# Patient Record
Sex: Male | Born: 1995 | Race: White | Hispanic: No | Marital: Single | Smoking: Never smoker
Health system: Southern US, Community
[De-identification: ages and names within clinical notes are randomized; demographics above are authoritative.]

## PROBLEM LIST (undated history)

## (undated) DIAGNOSIS — K219 Gastro-esophageal reflux disease without esophagitis: Principal | ICD-10-CM

## (undated) DIAGNOSIS — F319 Bipolar disorder, unspecified: Secondary | ICD-10-CM

## (undated) DIAGNOSIS — T7840XA Allergy, unspecified, initial encounter: Secondary | ICD-10-CM

## (undated) DIAGNOSIS — B019 Varicella without complication: Secondary | ICD-10-CM

## (undated) DIAGNOSIS — L709 Acne, unspecified: Secondary | ICD-10-CM

## (undated) DIAGNOSIS — R51 Headache: Secondary | ICD-10-CM

## (undated) DIAGNOSIS — J329 Chronic sinusitis, unspecified: Secondary | ICD-10-CM

## (undated) DIAGNOSIS — E663 Overweight: Secondary | ICD-10-CM

## (undated) DIAGNOSIS — F32A Depression, unspecified: Secondary | ICD-10-CM

## (undated) DIAGNOSIS — F329 Major depressive disorder, single episode, unspecified: Secondary | ICD-10-CM

## (undated) HISTORY — DX: Gastro-esophageal reflux disease without esophagitis: K21.9

## (undated) HISTORY — DX: Acne, unspecified: L70.9

## (undated) HISTORY — PX: TYMPANOSTOMY TUBE PLACEMENT: SHX32

## (undated) HISTORY — DX: Depression, unspecified: F32.A

## (undated) HISTORY — DX: Allergy, unspecified, initial encounter: T78.40XA

## (undated) HISTORY — DX: Chronic sinusitis, unspecified: J32.9

## (undated) HISTORY — DX: Bipolar disorder, unspecified: F31.9

## (undated) HISTORY — DX: Overweight: E66.3

## (undated) HISTORY — DX: Varicella without complication: B01.9

## (undated) HISTORY — DX: Headache: R51

## (undated) HISTORY — DX: Major depressive disorder, single episode, unspecified: F32.9

---
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
## 2013-05-06 ENCOUNTER — Ambulatory Visit (INDEPENDENT_AMBULATORY_CARE_PROVIDER_SITE_OTHER): Admitting: Family Medicine

## 2013-05-06 ENCOUNTER — Encounter: Payer: Self-pay | Admitting: Family Medicine

## 2013-05-06 VITALS — BP 112/80 | HR 72 | Temp 97.8°F | Ht 79.0 in | Wt 319.1 lb

## 2013-05-06 DIAGNOSIS — R109 Unspecified abdominal pain: Secondary | ICD-10-CM

## 2013-05-06 DIAGNOSIS — T7840XA Allergy, unspecified, initial encounter: Secondary | ICD-10-CM

## 2013-05-06 DIAGNOSIS — R10A0 Flank pain, unspecified side: Secondary | ICD-10-CM

## 2013-05-06 DIAGNOSIS — E663 Overweight: Secondary | ICD-10-CM

## 2013-05-06 DIAGNOSIS — R519 Headache, unspecified: Secondary | ICD-10-CM | POA: Insufficient documentation

## 2013-05-06 DIAGNOSIS — F329 Major depressive disorder, single episode, unspecified: Secondary | ICD-10-CM | POA: Insufficient documentation

## 2013-05-06 DIAGNOSIS — R5381 Other malaise: Secondary | ICD-10-CM

## 2013-05-06 DIAGNOSIS — R51 Headache: Secondary | ICD-10-CM

## 2013-05-06 DIAGNOSIS — F319 Bipolar disorder, unspecified: Secondary | ICD-10-CM

## 2013-05-06 DIAGNOSIS — R5383 Other fatigue: Secondary | ICD-10-CM

## 2013-05-06 DIAGNOSIS — B019 Varicella without complication: Secondary | ICD-10-CM | POA: Insufficient documentation

## 2013-05-06 DIAGNOSIS — K219 Gastro-esophageal reflux disease without esophagitis: Secondary | ICD-10-CM

## 2013-05-06 DIAGNOSIS — F32A Depression, unspecified: Secondary | ICD-10-CM

## 2013-05-06 HISTORY — DX: Headache: R51

## 2013-05-06 HISTORY — DX: Gastro-esophageal reflux disease without esophagitis: K21.9

## 2013-05-06 LAB — HEPATIC FUNCTION PANEL
ALT: 13 U/L (ref 0–53)
Albumin: 4.8 g/dL (ref 3.5–5.2)
Total Protein: 7.5 g/dL (ref 6.0–8.3)

## 2013-05-06 LAB — RENAL FUNCTION PANEL
Albumin: 4.8 g/dL (ref 3.5–5.2)
BUN: 11 mg/dL (ref 6–23)
Calcium: 10 mg/dL (ref 8.4–10.5)
Creat: 0.95 mg/dL (ref 0.50–1.35)
Glucose, Bld: 91 mg/dL (ref 70–99)
Phosphorus: 3.6 mg/dL (ref 2.3–4.6)

## 2013-05-06 LAB — CBC
Hemoglobin: 16 g/dL (ref 13.0–17.0)
MCHC: 34.8 g/dL (ref 30.0–36.0)
Platelets: 273 10*3/uL (ref 150–400)
RBC: 5.61 MIL/uL (ref 4.22–5.81)

## 2013-05-06 LAB — T4, FREE: Free T4: 1.17 ng/dL (ref 0.80–1.80)

## 2013-05-06 LAB — LIPID PANEL
Cholesterol: 181 mg/dL (ref 0–200)
Total CHOL/HDL Ratio: 4.9 Ratio
Triglycerides: 186 mg/dL — ABNORMAL HIGH (ref <150)
VLDL: 37 mg/dL (ref 0–40)

## 2013-05-06 MED ORDER — QUETIAPINE FUMARATE 100 MG PO TABS: 100.0000 mg | ORAL_TABLET | Freq: Every day | ORAL | Status: DC

## 2013-05-06 MED ORDER — CETIRIZINE HCL 10 MG PO TABS: 10.0000 mg | ORAL_TABLET | Freq: Every day | ORAL | Status: DC | PRN

## 2013-05-06 MED ORDER — RANITIDINE HCL 300 MG PO TABS: 300.0000 mg | ORAL_TABLET | Freq: Every evening | ORAL | Status: DC | PRN

## 2013-05-06 NOTE — Patient Instructions (Addendum)
Probiotic such as Digestive Advantage or a generic 2 Tums at bedtime  DASH Diet The DASH diet stands for "Dietary Approaches to Stop Hypertension." It is a healthy eating plan that has been shown to reduce high blood pressure (hypertension) in as little as 14 days, while also possibly providing other significant health benefits. These other health benefits include reducing the risk of breast cancer after menopause and reducing the risk of type 2 diabetes, heart disease, colon cancer, and stroke. Health benefits also include weight loss and slowing kidney failure in patients with chronic kidney disease.  DIET GUIDELINES  Limit salt (sodium). Your diet should contain less than 1500 mg of sodium daily.  Limit refined or processed carbohydrates. Your diet should include mostly whole grains. Desserts and added sugars should be used sparingly.  Include small amounts of heart-healthy fats. These types of fats include nuts, oils, and tub margarine. Limit saturated and trans fats. These fats have been shown to be harmful in the body. CHOOSING FOODS  The following food groups are based on a 2000 calorie diet. See your Registered Dietitian for individual calorie needs. Grains and Grain Products (6 to 8 servings daily)  Eat More Often: Whole-wheat bread, brown rice, whole-grain or wheat pasta, quinoa, popcorn without added fat or salt (air popped).  Eat Less Often: White bread, white pasta, white rice, cornbread. Vegetables (4 to 5 servings daily)  Eat More Often: Fresh, frozen, and canned vegetables. Vegetables may be raw, steamed, roasted, or grilled with a minimal amount of fat.  Eat Less Often/Avoid: Creamed or fried vegetables. Vegetables in a cheese sauce. Fruit (4 to 5 servings daily)  Eat More Often: All fresh, canned (in natural juice), or frozen fruits. Dried fruits without added sugar. One hundred percent fruit juice ( cup [237 mL] daily).  Eat Less Often: Dried fruits with added sugar.  Canned fruit in light or heavy syrup. [employer], Fish, and Poultry (2 servings or less daily. One serving is 3 to 4 oz [85-114 g]).  Eat More Often: Ninety percent or leaner ground beef, tenderloin, sirloin. Round cuts of beef, chicken breast, [place] breast. All fish. Grill, bake, or broil your meat. Nothing should be fried.  Eat Less Often/Avoid: Fatty cuts of meat, [place], or chicken leg, thigh, or wing. Fried cuts of meat or fish. Dairy (2 to 3 servings)  Eat More Often: Low-fat or fat-free milk, low-fat plain or light yogurt, reduced-fat or part-skim cheese.  Eat Less Often/Avoid: Milk (whole, 2%).Whole milk yogurt. Full-fat cheeses. Nuts, Seeds, and Legumes (4 to 5 servings per week)  Eat More Often: All without added salt.  Eat Less Often/Avoid: Salted nuts and seeds, canned beans with added salt. Fats and Sweets (limited)  Eat More Often: Vegetable oils, tub margarines without trans fats, sugar-free gelatin. Mayonnaise and salad dressings.  Eat Less Often/Avoid: Coconut oils, palm oils, butter, stick margarine, cream, half and half, cookies, candy, pie. FOR MORE INFORMATION The Dash Diet Eating Plan: www.dashdiet.org Document Released: 08/18/2011 Document Revised: 11/21/2011 Document Reviewed: 08/18/2011 [facility] Patient Information 2014 [place], [place].

## 2013-05-07 LAB — URINALYSIS
Bilirubin Urine: NEGATIVE
Glucose, UA: NEGATIVE mg/dL
Ketones, ur: NEGATIVE mg/dL
Specific Gravity, Urine: 1.022 (ref 1.005–1.030)
Urobilinogen, UA: 0.2 mg/dL (ref 0.0–1.0)

## 2013-05-07 NOTE — Progress Notes (Signed)
Quick Note:  Patient Informed and voiced understanding ______ 

## 2013-05-12 ENCOUNTER — Encounter: Payer: Self-pay | Admitting: Family Medicine

## 2013-05-12 DIAGNOSIS — T7840XA Allergy, unspecified, initial encounter: Secondary | ICD-10-CM

## 2013-05-12 HISTORY — DX: Allergy, unspecified, initial encounter: T78.40XA

## 2013-05-12 NOTE — Progress Notes (Signed)
Patient ID: Lawrence Collins, male   DOB: 04-21-1996, 17 y.o.   MRN: [id number] Lawrence Collins [id number] 09/05/1996 05/12/2013      Progress Note New Patient  Subjective  Chief Complaint  Chief Complaint  Patient presents with  . Establish Care    new patient    HPI  Patient is a 17 year old male in today for his mother to discuss multiple concerns. He has a history of depression and has been recently diagnosed with bipolar depression. He was started on Lamictal but he says it actually made him feel more manic with focusing and sleeping. He stopped it and feels better but still has high levels of anxiety and depression. Denies suicidal ideation but is ready to start a different medication. Is also complaining of fatigue. Complains of frequent headaches. Has pain behind his left eye with photophobia and phonophobia. Has some nausea but no vomiting. Has frequent heartburn and uses TUMS routinely. Has allergies with postnasal drip and nasal congestion. No fevers or chills. No chest pain, palpitations, shortness of breath, GI or GU concerns at this time.  Past Medical History  Diagnosis Date  . Depression     bi-polar- type 2  . Chicken pox 17 yrs old  . Overweight   . Esophageal reflux 05/06/2013  . Headache(784.0) 05/06/2013  . Allergic state 05/12/2013    Past Surgical History  Procedure Laterality Date  . Tympanostomy tube placement  as a child    Family History  Problem Relation Age of Onset  . Graves' disease Mother   . Alcohol abuse Father   . Depression Father   . Bipolar disorder Father   . Drug abuse Father   . Hypertension Father   . Hyperlipidemia Father   . Asthma Brother   . Depression Brother   . Cancer Maternal Grandmother 8    colon  . Emphysema Maternal Grandfather     smoker  . Hypertension Maternal Grandfather   . Hyperlipidemia Maternal Grandfather   . Alcohol abuse Paternal Grandmother   . Alcohol abuse Paternal Grandfather   . Mental illness Maternal  Aunt     schizophrenia, depression  . Depression Maternal Aunt     History   Social History  . Marital Status: Single    Spouse Name: N/A    Number of Children: N/A  . Years of Education: N/A   Occupational History  . Not on file.   Social History Main Topics  . Smoking status: Never Smoker   . Smokeless tobacco: Never Used  . Alcohol Use: No  . Drug Use: No  . Sexual Activity: No   Other Topics Concern  . Not on file   Social History Narrative  . No narrative on file    No current outpatient prescriptions on file prior to visit.   No current facility-administered medications on file prior to visit.    No Known Allergies  Review of Systems  Review of Systems  Constitutional: Positive for malaise/fatigue. Negative for fever and chills.  HENT: Positive for congestion. Negative for hearing loss and nosebleeds.   Eyes: Positive for photophobia. Negative for discharge.  Respiratory: Positive for sputum production. Negative for cough, shortness of breath and wheezing.   Cardiovascular: Negative for chest pain, palpitations and leg swelling.  Gastrointestinal: Positive for heartburn. Negative for nausea, vomiting, abdominal pain, diarrhea, constipation and blood in stool.  Genitourinary: Negative for dysuria, urgency, frequency and hematuria.  Musculoskeletal: Negative for myalgias, back pain and falls.  Skin: Negative  for rash.  Neurological: Positive for headaches. Negative for dizziness, tremors, sensory change, focal weakness, loss of consciousness and weakness.  Endo/Heme/Allergies: Negative for polydipsia. Does not bruise/bleed easily.  Psychiatric/Behavioral: Positive for depression. Negative for suicidal ideas. The patient is nervous/anxious. The patient does not have insomnia.     Objective  BP 112/80  Pulse 72  Temp(Src) 97.8 F (36.6 C) (Oral)  Ht 6\' 7"  (2.007 m)  Wt 319 lb 1.3 oz (144.734 kg)  BMI 35.93 kg/m2  SpO2 97%  Physical Exam  Physical  Exam  Constitutional: He is oriented to person, place, and time and well-developed, well-nourished, and in no distress. No distress.  HENT:  Head: Normocephalic and atraumatic.  Eyes: Conjunctivae are normal.  Neck: Neck supple. No thyromegaly present.  Cardiovascular: Normal rate, regular rhythm and normal heart sounds.   No murmur heard. Pulmonary/Chest: Effort normal and breath sounds normal. No respiratory distress.  Abdominal: He exhibits no distension and no mass. There is no tenderness.  Musculoskeletal: He exhibits no edema.  Neurological: He is alert and oriented to person, place, and time.  Skin: Skin is warm.  Psychiatric: Memory, affect and judgment normal.       Assessment & Plan  Headache(784.0) Discussed HA hygiene with regular exercise, good sleep, small frequent meals with lean proteins. Adequate hydration. May use over-the-counter Excedrin or ibuprofen when necessary  Depression Given Lamictal in the past by the mood clinic in [place]. Did not find it helpful. Says it actually made his mania worse. Agrees to start Seroquel and referred to psychiatry  For further consideration  Esophageal reflux Avoid offending foods, started on probiotics and Zantac  Overweight Encouraged DASH diet and increased exercise  Allergic state Cetirizine prn, nasal saline prn

## 2013-05-12 NOTE — Assessment & Plan Note (Addendum)
Given Lamictal in the past by the mood clinic in [place]. Did not find it helpful. Says it actually made his mania worse. Agrees to start Seroquel and referred to psychiatry  For further consideration

## 2013-05-12 NOTE — Assessment & Plan Note (Signed)
Discussed HA hygiene with regular exercise, good sleep, small frequent meals with lean proteins. Adequate hydration. May use over-the-counter Excedrin or ibuprofen when necessary

## 2013-05-12 NOTE — Assessment & Plan Note (Signed)
Encouraged DASH diet and increased exercise 

## 2013-05-12 NOTE — Assessment & Plan Note (Signed)
Avoid offending foods, started on probiotics and Zantac

## 2013-05-12 NOTE — Assessment & Plan Note (Signed)
Cetirizine prn, nasal saline prn

## 2013-05-31 ENCOUNTER — Encounter: Payer: Self-pay | Admitting: Family Medicine

## 2013-05-31 ENCOUNTER — Ambulatory Visit (INDEPENDENT_AMBULATORY_CARE_PROVIDER_SITE_OTHER): Admitting: Family Medicine

## 2013-05-31 VITALS — BP 130/82 | HR 91 | Temp 98.4°F | Ht 79.0 in | Wt 320.1 lb

## 2013-05-31 DIAGNOSIS — R51 Headache: Secondary | ICD-10-CM

## 2013-05-31 DIAGNOSIS — J029 Acute pharyngitis, unspecified: Secondary | ICD-10-CM

## 2013-05-31 DIAGNOSIS — Z5189 Encounter for other specified aftercare: Secondary | ICD-10-CM

## 2013-05-31 DIAGNOSIS — T7840XD Allergy, unspecified, subsequent encounter: Secondary | ICD-10-CM

## 2013-05-31 DIAGNOSIS — Z23 Encounter for immunization: Secondary | ICD-10-CM

## 2013-05-31 DIAGNOSIS — K219 Gastro-esophageal reflux disease without esophagitis: Secondary | ICD-10-CM

## 2013-05-31 DIAGNOSIS — J329 Chronic sinusitis, unspecified: Secondary | ICD-10-CM

## 2013-05-31 DIAGNOSIS — F329 Major depressive disorder, single episode, unspecified: Secondary | ICD-10-CM

## 2013-05-31 MED ORDER — LAMOTRIGINE 25 MG PO TABS: ORAL_TABLET | ORAL | Status: DC

## 2013-05-31 MED ORDER — AZITHROMYCIN 250 MG PO TABS: ORAL_TABLET | ORAL | Status: DC

## 2013-05-31 NOTE — Patient Instructions (Addendum)
Consider Krill oil caps such as MegaRed daily by [employer] probiotic such as Digestive Advantage daily   Gastroesophageal Reflux Disease, Adult Gastroesophageal reflux disease (GERD) happens when acid from your stomach flows up into the esophagus. When acid comes in contact with the esophagus, the acid causes soreness (inflammation) in the esophagus. Over time, GERD may create small holes (ulcers) in the lining of the esophagus. CAUSES   Increased body weight. This puts pressure on the stomach, making acid rise from the stomach into the esophagus.  Smoking. This increases acid production in the stomach.  Drinking alcohol. This causes decreased pressure in the lower esophageal sphincter (valve or ring of muscle between the esophagus and stomach), allowing acid from the stomach into the esophagus.  Late evening meals and a full stomach. This increases pressure and acid production in the stomach.  A malformed lower esophageal sphincter. Sometimes, no cause is found. SYMPTOMS   Burning pain in the lower part of the mid-chest behind the breastbone and in the mid-stomach area. This may occur twice a week or more often.  Trouble swallowing.  Sore throat.  Dry cough.  Asthma-like symptoms including chest tightness, shortness of breath, or wheezing. DIAGNOSIS  Your caregiver may be able to diagnose GERD based on your symptoms. In some cases, X-rays and other tests may be done to check for complications or to check the condition of your stomach and esophagus. TREATMENT  Your caregiver may recommend over-the-counter or prescription medicines to help decrease acid production. Ask your caregiver before starting or adding any new medicines.  HOME CARE INSTRUCTIONS   Change the factors that you can control. Ask your caregiver for guidance concerning weight loss, quitting smoking, and alcohol consumption.  Avoid foods and drinks that make your symptoms worse, such as:  Caffeine or  alcoholic drinks.  Chocolate.  Peppermint or mint flavorings.  Garlic and onions.  Spicy foods.  Citrus fruits, such as oranges, lemons, or limes.  Tomato-based foods such as sauce, chili, salsa, and pizza.  Fried and fatty foods.  Avoid lying down for the 3 hours prior to your bedtime or prior to taking a nap.  Eat small, frequent meals instead of large meals.  Wear loose-fitting clothing. Do not wear anything tight around your waist that causes pressure on your stomach.  Raise the head of your bed 6 to 8 inches with wood blocks to help you sleep. Extra pillows will not help.  Only take over-the-counter or prescription medicines for pain, discomfort, or fever as directed by your caregiver.  Do not take aspirin, ibuprofen, or other nonsteroidal anti-inflammatory drugs (NSAIDs). SEEK IMMEDIATE MEDICAL CARE IF:   You have pain in your arms, neck, jaw, teeth, or back.  Your pain increases or changes in intensity or duration.  You develop nausea, vomiting, or sweating (diaphoresis).  You develop shortness of breath, or you faint.  Your vomit is green, yellow, black, or looks like coffee grounds or blood.  Your stool is red, bloody, or black. These symptoms could be signs of other problems, such as heart disease, gastric bleeding, or esophageal bleeding. MAKE SURE YOU:   Understand these instructions.  Will watch your condition.  Will get help right away if you are not doing well or get worse. Document Released: 06/08/2005 Document Revised: 11/21/2011 Document Reviewed: 03/18/2011 [facility] Patient Information 2014 [place], [place].

## 2013-06-02 ENCOUNTER — Encounter: Payer: Self-pay | Admitting: Family Medicine

## 2013-06-02 DIAGNOSIS — J329 Chronic sinusitis, unspecified: Secondary | ICD-10-CM

## 2013-06-02 HISTORY — DX: Chronic sinusitis, unspecified: J32.9

## 2013-06-02 NOTE — Assessment & Plan Note (Signed)
Patient not sure he has noted a difference but will continue Zyrtec daily for now

## 2013-06-02 NOTE — Assessment & Plan Note (Signed)
improving

## 2013-06-02 NOTE — Assessment & Plan Note (Signed)
Patient started and titrated up Seroquel without difficulty and is taking 300 mg a day. Because he was worried about back slightly he continues his Lamictal. He does report he feels more stable and is at no risk to himself. Will have him drop his Lamictal down to 50 mg daily for the next week and then 25 mg daily and maintain adequate he seen by psychiatry. He has an appointment with psychiatry early October. No trouble with the Seroquel as noted continue 300 mg daily

## 2013-06-02 NOTE — Progress Notes (Signed)
Patient ID: Lawrence Collins, male   DOB: [birth year], 17 y.o.   MRN: [id number] Lawrence Collins [id number] May 30, 1996 06/02/2013      Progress Note-Follow Up  Subjective  Chief Complaint  Chief Complaint  Patient presents with  . Follow-up    3 week    HPI  This is a 17 year old Caucasian male in today for followup. He is doing better since the initiation of Seroquel. Because he was worried about However he did continue his Lamictal as well. He denies any chest pain, palpitations, shortness of breath, increasing anxiety and depression in fact does report he feels more stable. Continues to struggle with sputum production and a sense of globus. Coughs up sputum regularly. Has headaches although they are somewhat improved. No fevers or chills. Has appointment with psychiatry in the next 2 weeks. Has been noting a sore throat and some recent trouble with swallowing as well. Does report his dyspepsia is somewhat improved. No chest pain or palpitations no change in bowel habits no urinary complaints  Past Medical History  Diagnosis Date  . Depression     bi-polar- type 2  . Chicken pox 17 yrs old  . Overweight   . Esophageal reflux 05/06/2013  . Headache(784.0) 05/06/2013  . Allergic state 05/12/2013  . Unspecified sinusitis (chronic) 06/02/2013    Past Surgical History  Procedure Laterality Date  . Tympanostomy tube placement  as a child    Family History  Problem Relation Age of Onset  . Graves' disease Mother   . Alcohol abuse Father   . Depression Father   . Bipolar disorder Father   . Drug abuse Father   . Hypertension Father   . Hyperlipidemia Father   . Asthma Brother   . Depression Brother   . Cancer Maternal Grandmother 23    colon  . Emphysema Maternal Grandfather     smoker  . Hypertension Maternal Grandfather   . Hyperlipidemia Maternal Grandfather   . Alcohol abuse Paternal Grandmother   . Alcohol abuse Paternal Grandfather   . Mental illness Maternal Aunt      schizophrenia, depression  . Depression Maternal Aunt     History   Social History  . Marital Status: Single    Spouse Name: N/A    Number of Children: N/A  . Years of Education: N/A   Occupational History  . Not on file.   Social History Main Topics  . Smoking status: Never Smoker   . Smokeless tobacco: Never Used  . Alcohol Use: No  . Drug Use: No  . Sexual Activity: No   Other Topics Concern  . Not on file   Social History Narrative  . No narrative on file    Current Outpatient Prescriptions on File Prior to Visit  Medication Sig Dispense Refill  . cetirizine (ZYRTEC) 10 MG tablet Take 1 tablet (10 mg total) by mouth daily as needed for allergies or rhinitis.  30 tablet  2  . QUEtiapine (SEROQUEL) 100 MG tablet Take 1 tablet (100 mg total) by mouth at bedtime. 1/2 tab po once then increase to 1 tab po qhs x once then increases to 2 tabs po qhs x once then 3 tabs po qhs  90 tablet  1  . ranitidine (ZANTAC) 300 MG tablet Take 1 tablet (300 mg total) by mouth at bedtime as needed for heartburn.  30 tablet  2   No current facility-administered medications on file prior to visit.    No Known Allergies  Review of Systems  Review of Systems  Constitutional: Positive for malaise/fatigue. Negative for fever.  HENT: Positive for congestion.   Eyes: Negative for discharge.  Respiratory: Positive for cough and sputum production. Negative for shortness of breath.   Cardiovascular: Negative for chest pain, palpitations and leg swelling.  Gastrointestinal: Negative for nausea, abdominal pain and diarrhea.  Genitourinary: Negative for dysuria.  Musculoskeletal: Negative for falls.  Skin: Negative for rash.  Neurological: Negative for loss of consciousness and headaches.  Endo/Heme/Allergies: Negative for polydipsia.  Psychiatric/Behavioral: Positive for depression. Negative for suicidal ideas. The patient is not nervous/anxious and does not have insomnia.       Objective  BP 130/82  Pulse 91  Temp(Src) 98.4 F (36.9 C) (Oral)  Ht 6\' 7"  (2.007 m)  Wt 320 lb 1.3 oz (145.187 kg)  BMI 36.04 kg/m2  SpO2 98%  Physical Exam  Physical Exam  Constitutional: He is oriented to person, place, and time and well-developed, well-nourished, and in no distress. No distress.  HENT:  Head: Normocephalic and atraumatic.  Eyes: Conjunctivae are normal.  Neck: Neck supple. No thyromegaly present.  Cardiovascular: Normal rate, regular rhythm and normal heart sounds.   No murmur heard. Pulmonary/Chest: Effort normal and breath sounds normal. No respiratory distress.  Abdominal: He exhibits no distension and no mass. There is no tenderness.  Musculoskeletal: He exhibits no edema.  Neurological: He is alert and oriented to person, place, and time.  Skin: Skin is warm.  Psychiatric: Memory, affect and judgment normal.    Lab Results  Component Value Date   TSH 2.844 05/06/2013   Lab Results  Component Value Date   WBC 8.2 05/06/2013   HGB 16.0 05/06/2013   HCT 46.0 05/06/2013   MCV 82.0 05/06/2013   PLT 273 05/06/2013   Lab Results  Component Value Date   CREATININE 0.95 05/06/2013   BUN 11 05/06/2013   NA 141 05/06/2013   K 4.9 05/06/2013   CL 106 05/06/2013   CO2 26 05/06/2013   Lab Results  Component Value Date   ALT 13 05/06/2013   AST 15 05/06/2013   ALKPHOS 75 05/06/2013   BILITOT 0.6 05/06/2013   Lab Results  Component Value Date   CHOL 181 05/06/2013   Lab Results  Component Value Date   HDL 37* 05/06/2013   Lab Results  Component Value Date   LDLCALC 107* 05/06/2013   Lab Results  Component Value Date   TRIG 186* 05/06/2013   Lab Results  Component Value Date   CHOLHDL 4.9 05/06/2013     Assessment & Plan  Depression Patient started and titrated up Seroquel without difficulty and is taking 300 mg a day. Because he was worried about back slightly he continues his Lamictal. He does report he feels more stable and is at no  risk to himself. Will have him drop his Lamictal down to 50 mg daily for the next week and then 25 mg daily and maintain adequate he seen by psychiatry. He has an appointment with psychiatry early October. No trouble with the Seroquel as noted continue 300 mg daily  Allergic state Patient not sure he has noted a difference but will continue Zyrtec daily for now  Esophageal reflux Improved with Zantac, encouraged probiotic as well. Does still note some mild dysphagia still will continue current treatment and consider referral to Gastroenterology if symptoms do not improve.   Headache(784.0) improving  Unspecified sinusitis (chronic) Will try a course of antibiotics, probiotics and mucinex

## 2013-06-02 NOTE — Assessment & Plan Note (Signed)
Will try a course of antibiotics, probiotics and mucinex

## 2013-06-02 NOTE — Assessment & Plan Note (Signed)
Improved with Zantac, encouraged probiotic as well. Does still note some mild dysphagia still will continue current treatment and consider referral to Gastroenterology if symptoms do not improve.

## 2013-06-13 ENCOUNTER — Ambulatory Visit (HOSPITAL_COMMUNITY): Admitting: Physician Assistant

## 2013-06-13 ENCOUNTER — Encounter (HOSPITAL_COMMUNITY): Payer: Self-pay | Admitting: Psychiatry

## 2013-06-13 ENCOUNTER — Ambulatory Visit (INDEPENDENT_AMBULATORY_CARE_PROVIDER_SITE_OTHER): Admitting: Psychiatry

## 2013-06-13 VITALS — BP 116/70 | HR 75 | Ht 79.0 in | Wt 316.8 lb

## 2013-06-13 DIAGNOSIS — F3130 Bipolar disorder, current episode depressed, mild or moderate severity, unspecified: Secondary | ICD-10-CM

## 2013-06-13 DIAGNOSIS — F319 Bipolar disorder, unspecified: Secondary | ICD-10-CM

## 2013-06-13 MED ORDER — BUPROPION HCL ER (XL) 150 MG PO TB24: 150.0000 mg | ORAL_TABLET | ORAL | Status: DC
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
## 2013-06-28 ENCOUNTER — Ambulatory Visit: Payer: Self-pay | Admitting: Family Medicine

## 2013-07-01 ENCOUNTER — Ambulatory Visit (HOSPITAL_COMMUNITY): Payer: Self-pay | Admitting: Psychiatry

## 2013-07-12 ENCOUNTER — Encounter: Payer: Self-pay | Admitting: Family Medicine

## 2013-07-12 ENCOUNTER — Ambulatory Visit (INDEPENDENT_AMBULATORY_CARE_PROVIDER_SITE_OTHER): Admitting: Family Medicine

## 2013-07-12 VITALS — BP 124/88 | HR 67 | Temp 98.5°F | Ht 79.0 in | Wt 315.1 lb

## 2013-07-12 DIAGNOSIS — E663 Overweight: Secondary | ICD-10-CM

## 2013-07-12 DIAGNOSIS — F3130 Bipolar disorder, current episode depressed, mild or moderate severity, unspecified: Secondary | ICD-10-CM

## 2013-07-12 DIAGNOSIS — F319 Bipolar disorder, unspecified: Secondary | ICD-10-CM

## 2013-07-12 MED ORDER — QUETIAPINE FUMARATE 300 MG PO TABS: 300.0000 mg | ORAL_TABLET | Freq: Every day | ORAL | Status: DC

## 2013-07-12 NOTE — Patient Instructions (Signed)
Manic Depression (Bipolar Disorder)  Bipolar disorder is also known as manic depressive illness. It is when the brain does not function properly and causes shifts in a person's moods, energy and ability to function in everyday life. These shifts are different from the normal ups and downs that everyone experiences. Instead the shifts are severe. If this goes untreated, the person's life becomes more and more disorderly. People with this disorder can be treated can lead full and productive lives. This disorder must be managed throughout life.   SYMPTOMS    Bipolar disorder causes dramatic mood swings. These mood swings go in cycles. They cycle from extreme "highs" and irritable to deep "lows" of sadness and hopelessness.   Between the extreme moods, there are usually periods of normal mood.   Along with the mood shifts, the person will have severe changes in energy and behavior. The periods of "highs" and "lows" are called episodes of mania and depression.  Signs of mania:   Lots of energy, activity and restlessness.   Extreme "high" or good mood.   Extreme irritability.   Racing thoughts and talking very fast.   Jumping from one idea to another.   Not able to focus, easily distracted.   Little need to sleep.   Grand beliefs in one's abilities and powers.   Spending sprees.   Increased sexual drive. This can result in many sexual partners.   Poor judgment.   Abuse of drugs, particularly cocaine, alcohol, and sleeping medication.   Aggressive or provocative behavior.   A lasting period of behavior that is different from usual.   Denial that anything is wrong.  *A manic episode is identified if a "high" mood happens with three or more of the other symptoms lasting most of the day, nearly everyday for a week or longer. If the mood is more irritable in nature, four additional symptoms must be present.  Signs of depression:   Lasting feelings of sadness, anxiety, or empty mood.   Feelings of  hopelessness with negative thoughts.   Feelings of guilt, worthlessness, or helplessness.   Loss of interest or pleasure in activities once enjoyed, including sex.   Feelings of fatigue or having less energy.   Trouble focusing, making decisions, remembering.   Feeling restless or irritable.   Sleeping too little or too much.   Change in eating with possible weight gain or loss.   Feeling ongoing pain that is not caused by physical illness or injury.   Thoughts of death or suicide or suicide attempts.  *A depressive episode is identified as having five or more of the above symptoms that last most of the day, nearly everyday for two weeks or longer.  CAUSES    Research shows that there is no single cause for the disorder. Many factors act together to produce the illness.   This can be passed down from family (hereditary).   Environment may play a part.  TREATMENT    Long-term treatment is strongly recommended because bipolar disorder is a repeated illness. This disorder is better controlled if treatment is ongoing than if it is off and on.   A combination of medication and talk therapy is best for managing the disorder over time.   Medication.   Medication can be prescribed by a doctor that is an expert in treating mental disorders (psychiatrists). Medications known as "mood stabilizers" are usually prescribed to help control the illness. Other medications can be added when needed. These medicines usually treat episodes   of mania or depression that break through despite the mood stabilizer.   Talk Therapy.   Along with medication, some forms of talk therapy are helpful in providing support, education and guidance to people with the illness and their families. Studies show that this type of treatment increases mood stability, decreases need for hospitalization and improves how they function society.   Electroconvulsive Therapy (ECT).   In extreme situations where the above treatments do not work or  work too slowly to relieve severe symptoms, ECT may be considered.  Document Released: 12/05/2000 Document Revised: 11/21/2011 Document Reviewed: 07/27/2007  Exit[employer] Patient Information 2014 [employer].

## 2013-07-14 ENCOUNTER — Encounter: Payer: Self-pay | Admitting: Family Medicine

## 2013-07-14 NOTE — Assessment & Plan Note (Signed)
Improving on Seroquel and Wellbutrin. Will continue to follow with psychiatry.

## 2013-07-14 NOTE — Progress Notes (Signed)
Patient ID: Lawrence Collins, male   DOB: 12/11/1995, 17 y.o.   MRN: [id number] Lawrence Collins [id number] 1996-07-22 07/14/2013      Progress Note-Follow Up  Subjective  Chief Complaint  Chief Complaint  Patient presents with  . Follow-up    4 week    HPI  Patient is a 17 year old Caucasian male who is here today in followup. At his last visit we started him on Cipro and referred him to psychiatry. He has been diagnosed with bipolar disorder and they have added Wellbutrin. He reports having some hallucinations last week but none this week. Denies suicidal or homicidal ideation and says he does feel better and more calm. No other acute complaints. No chest pain, palpitations, shortness of breath, GI or GU concerns at this time.  Past Medical History  Diagnosis Date  . Depression     bi-polar- type 2  . Chicken pox 17 yrs old  . Overweight   . Esophageal reflux 05/06/2013  . Headache(784.0) 05/06/2013  . Allergic state 05/12/2013  . Unspecified sinusitis (chronic) 06/02/2013  . Bipolar depression     bi-polar- type 2 Lamictal made mania worse     Past Surgical History  Procedure Laterality Date  . Tympanostomy tube placement  as a child    Family History  Problem Relation Age of Onset  . Graves' disease Mother   . Alcohol abuse Father   . Depression Father   . Bipolar disorder Father   . Drug abuse Father   . Hypertension Father   . Hyperlipidemia Father   . Asthma Brother   . Depression Brother   . Cancer Maternal Grandmother 67    colon  . Emphysema Maternal Grandfather     smoker  . Hypertension Maternal Grandfather   . Hyperlipidemia Maternal Grandfather   . Alcohol abuse Paternal Grandmother   . Alcohol abuse Paternal Grandfather   . Mental illness Maternal Aunt     schizophrenia, depression  . Depression Maternal Aunt     History   Social History  . Marital Status: Single    Spouse Name: N/A    Number of Children: N/A  . Years of Education: N/A    Occupational History  . Not on file.   Social History Main Topics  . Smoking status: Never Smoker   . Smokeless tobacco: Never Used  . Alcohol Use: No  . Drug Use: No  . Sexual Activity: No   Other Topics Concern  . Not on file   Social History Narrative  . No narrative on file    Current Outpatient Prescriptions on File Prior to Visit  Medication Sig Dispense Refill  . buPROPion (WELLBUTRIN XL) 150 MG 24 hr tablet Take 1 tablet (150 mg total) by mouth every morning.  30 tablet  0  . cetirizine (ZYRTEC) 10 MG tablet Take 1 tablet (10 mg total) by mouth daily as needed for allergies or rhinitis.  30 tablet  2  . ranitidine (ZANTAC) 300 MG tablet Take 1 tablet (300 mg total) by mouth at bedtime as needed for heartburn.  30 tablet  2   No current facility-administered medications on file prior to visit.    No Known Allergies  Review of Systems  Review of Systems  Constitutional: Negative for fever and malaise/fatigue.  HENT: Negative for congestion.   Eyes: Negative for discharge.  Respiratory: Negative for shortness of breath.   Cardiovascular: Negative for chest pain, palpitations and leg swelling.  Gastrointestinal: Negative for nausea,  abdominal pain and diarrhea.  Genitourinary: Negative for dysuria.  Musculoskeletal: Negative for falls.  Skin: Negative for rash.  Neurological: Negative for loss of consciousness and headaches.  Endo/Heme/Allergies: Negative for polydipsia.  Psychiatric/Behavioral: Positive for depression. Negative for suicidal ideas and substance abuse. The patient is nervous/anxious. The patient does not have insomnia.        Has some hallucinations last week, none now    Objective  BP 124/88  Pulse 67  Temp(Src) 98.5 F (36.9 C) (Oral)  Ht 6\' 7"  (2.007 m)  Wt 315 lb 1.3 oz (142.919 kg)  BMI 35.48 kg/m2  SpO2 97%  Physical Exam  Physical Exam  Constitutional: He is oriented to person, place, and time and well-developed,  well-nourished, and in no distress. No distress.  HENT:  Head: Normocephalic and atraumatic.  Eyes: Conjunctivae are normal.  Neck: Neck supple. No thyromegaly present.  Cardiovascular: Normal rate, regular rhythm and normal heart sounds.   No murmur heard. Pulmonary/Chest: Effort normal and breath sounds normal. No respiratory distress.  Abdominal: He exhibits no distension and no mass. There is no tenderness.  Musculoskeletal: He exhibits no edema.  Neurological: He is alert and oriented to person, place, and time.  Skin: Skin is warm.  Psychiatric: Memory, affect and judgment normal.    Lab Results  Component Value Date   TSH 2.844 05/06/2013   Lab Results  Component Value Date   WBC 8.2 05/06/2013   HGB 16.0 05/06/2013   HCT 46.0 05/06/2013   MCV 82.0 05/06/2013   PLT 273 05/06/2013   Lab Results  Component Value Date   CREATININE 0.95 05/06/2013   BUN 11 05/06/2013   NA 141 05/06/2013   K 4.9 05/06/2013   CL 106 05/06/2013   CO2 26 05/06/2013   Lab Results  Component Value Date   ALT 13 05/06/2013   AST 15 05/06/2013   ALKPHOS 75 05/06/2013   BILITOT 0.6 05/06/2013   Lab Results  Component Value Date   CHOL 181 05/06/2013   Lab Results  Component Value Date   HDL 37* 05/06/2013   Lab Results  Component Value Date   LDLCALC 107* 05/06/2013   Lab Results  Component Value Date   TRIG 186* 05/06/2013   Lab Results  Component Value Date   CHOLHDL 4.9 05/06/2013     Assessment & Plan  Bipolar depression Improving on Seroquel and Wellbutrin. Will continue to follow with psychiatry.   Overweight Encouraged DASH diet and increased exercise

## 2013-07-14 NOTE — Assessment & Plan Note (Signed)
Encouraged DASH diet and increased exercise 

## 2013-07-28 ENCOUNTER — Other Ambulatory Visit (HOSPITAL_COMMUNITY): Payer: Self-pay | Admitting: Psychiatry

## 2013-07-28 DIAGNOSIS — F3130 Bipolar disorder, current episode depressed, mild or moderate severity, unspecified: Secondary | ICD-10-CM

## 2013-08-02 ENCOUNTER — Other Ambulatory Visit: Payer: Self-pay | Admitting: Family Medicine

## 2013-08-02 NOTE — Telephone Encounter (Signed)
Rx request to pharmacy/SL[facility]  

## 2013-08-06 ENCOUNTER — Ambulatory Visit (HOSPITAL_COMMUNITY): Payer: Self-pay | Admitting: Psychiatry

## 2013-09-03 ENCOUNTER — Encounter: Payer: Self-pay | Admitting: Family Medicine

## 2013-09-03 ENCOUNTER — Ambulatory Visit (INDEPENDENT_AMBULATORY_CARE_PROVIDER_SITE_OTHER): Admitting: Family Medicine

## 2013-09-03 VITALS — BP 130/86 | HR 81 | Temp 98.2°F | Ht 79.0 in | Wt 318.1 lb

## 2013-09-03 DIAGNOSIS — K3189 Other diseases of stomach and duodenum: Secondary | ICD-10-CM

## 2013-09-03 DIAGNOSIS — F319 Bipolar disorder, unspecified: Secondary | ICD-10-CM

## 2013-09-03 DIAGNOSIS — K219 Gastro-esophageal reflux disease without esophagitis: Secondary | ICD-10-CM

## 2013-09-03 DIAGNOSIS — J329 Chronic sinusitis, unspecified: Secondary | ICD-10-CM

## 2013-09-03 DIAGNOSIS — R1013 Epigastric pain: Secondary | ICD-10-CM

## 2013-09-03 DIAGNOSIS — F3130 Bipolar disorder, current episode depressed, mild or moderate severity, unspecified: Secondary | ICD-10-CM

## 2013-09-03 MED ORDER — QUETIAPINE FUMARATE 400 MG PO TABS: 400.0000 mg | ORAL_TABLET | Freq: Every day | ORAL | Status: DC

## 2013-09-03 NOTE — Patient Instructions (Signed)
Increase hydration  Gastroesophageal Reflux Disease, Adult Gastroesophageal reflux disease (GERD) happens when acid from your stomach flows up into the esophagus. When acid comes in contact with the esophagus, the acid causes soreness (inflammation) in the esophagus. Over time, GERD may create small holes (ulcers) in the lining of the esophagus. CAUSES   Increased body weight. This puts pressure on the stomach, making acid rise from the stomach into the esophagus.  Smoking. This increases acid production in the stomach.  Drinking alcohol. This causes decreased pressure in the lower esophageal sphincter (valve or ring of muscle between the esophagus and stomach), allowing acid from the stomach into the esophagus.  Late evening meals and a full stomach. This increases pressure and acid production in the stomach.  A malformed lower esophageal sphincter. Sometimes, no cause is found. SYMPTOMS   Burning pain in the lower part of the mid-chest behind the breastbone and in the mid-stomach area. This may occur twice a week or more often.  Trouble swallowing.  Sore throat.  Dry cough.  Asthma-like symptoms including chest tightness, shortness of breath, or wheezing. DIAGNOSIS  Your caregiver may be able to diagnose GERD based on your symptoms. In some cases, X-rays and other tests may be done to check for complications or to check the condition of your stomach and esophagus. TREATMENT  Your caregiver may recommend over-the-counter or prescription medicines to help decrease acid production. Ask your caregiver before starting or adding any new medicines.  HOME CARE INSTRUCTIONS   Change the factors that you can control. Ask your caregiver for guidance concerning weight loss, quitting smoking, and alcohol consumption.  Avoid foods and drinks that make your symptoms worse, such as:  Caffeine or alcoholic drinks.  Chocolate.  Peppermint or mint flavorings.  Garlic and onions.  Spicy  foods.  Citrus fruits, such as oranges, lemons, or limes.  Tomato-based foods such as sauce, chili, salsa, and pizza.  Fried and fatty foods.  Avoid lying down for the 3 hours prior to your bedtime or prior to taking a nap.  Eat small, frequent meals instead of large meals.  Wear loose-fitting clothing. Do not wear anything tight around your waist that causes pressure on your stomach.  Raise the head of your bed 6 to 8 inches with wood blocks to help you sleep. Extra pillows will not help.  Only take over-the-counter or prescription medicines for pain, discomfort, or fever as directed by your caregiver.  Do not take aspirin, ibuprofen, or other nonsteroidal anti-inflammatory drugs (NSAIDs). SEEK IMMEDIATE MEDICAL CARE IF:   You have pain in your arms, neck, jaw, teeth, or back.  Your pain increases or changes in intensity or duration.  You develop nausea, vomiting, or sweating (diaphoresis).  You develop shortness of breath, or you faint.  Your vomit is green, yellow, black, or looks like coffee grounds or blood.  Your stool is red, bloody, or black. These symptoms could be signs of other problems, such as heart disease, gastric bleeding, or esophageal bleeding. MAKE SURE YOU:   Understand these instructions.  Will watch your condition.  Will get help right away if you are not doing well or get worse. Document Released: 06/08/2005 Document Revised: 11/21/2011 Document Reviewed: 03/18/2011 [facility] Patient Information 2014 [place], [place].

## 2013-09-04 LAB — H. PYLORI ANTIBODY, IGG: H Pylori IgG: 0.84 {ISR}

## 2013-09-08 NOTE — Progress Notes (Signed)
Patient ID: Lawrence Collins, male   DOB: 06/11/1996, 17 y.o.   MRN: [id number] Lawrence Collins [id number] June 05, 1996 09/08/2013      Progress Note-Follow Up  Subjective  Chief Complaint  Chief Complaint  Patient presents with  . Follow-up    8 week    HPI   patient is a 17 year old Caucasian male who is in today for followup. Unfortunately he has numerous concerns. His shoulder continues to cause him discomfort and decreased range of motion. He also notes blood sugars have been a size 47. He is taking his metformin twice daily. No chest pain, palpitations or shortness of breath. No GI or GU complaints at this time.   Past Medical History  Diagnosis Date  . Depression     bi-polar- type 2  . Chicken pox 17 yrs old  . Overweight   . Esophageal reflux 05/06/2013  . Headache(784.0) 05/06/2013  . Allergic state 05/12/2013  . Unspecified sinusitis (chronic) 06/02/2013  . Bipolar depression     bi-polar- type 2 Lamictal made mania worse     Past Surgical History  Procedure Laterality Date  . Tympanostomy tube placement  as a child    Family History  Problem Relation Age of Onset  . Graves' disease Mother   . Alcohol abuse Father   . Depression Father   . Bipolar disorder Father   . Drug abuse Father   . Hypertension Father   . Hyperlipidemia Father   . Asthma Brother   . Depression Brother   . Cancer Maternal Grandmother 35    colon  . Emphysema Maternal Grandfather     smoker  . Hypertension Maternal Grandfather   . Hyperlipidemia Maternal Grandfather   . Alcohol abuse Paternal Grandmother   . Alcohol abuse Paternal Grandfather   . Mental illness Maternal Aunt     schizophrenia, depression  . Depression Maternal Aunt     History   Social History  . Marital Status: Single    Spouse Name: N/A    Number of Children: N/A  . Years of Education: N/A   Occupational History  . Not on file.   Social History Main Topics  . Smoking status: Never Smoker   . Smokeless  tobacco: Never Used  . Alcohol Use: No  . Drug Use: No  . Sexual Activity: No   Other Topics Concern  . Not on file   Social History Narrative  . No narrative on file    Current Outpatient Prescriptions on File Prior to Visit  Medication Sig Dispense Refill  . buPROPion (WELLBUTRIN XL) 150 MG 24 hr tablet TAKE 1 TABLET (150 MG TOTAL) BY MOUTH EVERY MORNING.  30 tablet  0  . cetirizine (ZYRTEC) 10 MG tablet TAKE 1 TABLET (10 MG TOTAL) BY MOUTH DAILY AS NEEDED FOR ALLERGIES OR RHINITIS.  30 tablet  2  . ranitidine (ZANTAC) 300 MG tablet TAKE 1 TABLET (300 MG TOTAL) BY MOUTH AT BEDTIME AS NEEDED FOR HEARTBURN.  30 tablet  2   No current facility-administered medications on file prior to visit.    No Known Allergies  Review of Systems  Review of Systems  Constitutional: Negative for fever and malaise/fatigue.  HENT: Positive for congestion.   Eyes: Negative for discharge.  Respiratory: Negative for shortness of breath.   Cardiovascular: Negative for chest pain, palpitations and leg swelling.  Gastrointestinal: Negative for nausea, abdominal pain and diarrhea.  Genitourinary: Negative for dysuria.  Musculoskeletal: Positive for joint pain. Negative for  falls.  Skin: Negative for rash.  Neurological: Negative for loss of consciousness and headaches.  Endo/Heme/Allergies: Negative for polydipsia.  Psychiatric/Behavioral: Negative for depression and suicidal ideas. The patient is not nervous/anxious and does not have insomnia.     Objective  BP 130/86  Pulse 81  Temp(Src) 98.2 F (36.8 C) (Oral)  Ht 6\' 7"  (2.007 m)  Wt 318 lb 1.9 oz (144.298 kg)  BMI 35.82 kg/m2  SpO2 98%  Physical Exam  Physical Exam  Constitutional: He is oriented to person, place, and time and well-developed, well-nourished, and in no distress. No distress.  HENT:  Head: Normocephalic and atraumatic.  Eyes: Conjunctivae are normal.  Neck: Neck supple. No thyromegaly present.  Cardiovascular:  Normal rate, regular rhythm and normal heart sounds.   No murmur heard. Pulmonary/Chest: Effort normal and breath sounds normal. No respiratory distress.  Abdominal: He exhibits no distension and no mass. There is no tenderness.  Musculoskeletal: He exhibits no edema.  Neurological: He is alert and oriented to person, place, and time.  Skin: Skin is warm.  Psychiatric: Memory, affect and judgment normal.    Lab Results  Component Value Date   TSH 2.844 05/06/2013   Lab Results  Component Value Date   WBC 8.2 05/06/2013   HGB 16.0 05/06/2013   HCT 46.0 05/06/2013   MCV 82.0 05/06/2013   PLT 273 05/06/2013   Lab Results  Component Value Date   CREATININE 0.95 05/06/2013   BUN 11 05/06/2013   NA 141 05/06/2013   K 4.9 05/06/2013   CL 106 05/06/2013   CO2 26 05/06/2013   Lab Results  Component Value Date   ALT 13 05/06/2013   AST 15 05/06/2013   ALKPHOS 75 05/06/2013   BILITOT 0.6 05/06/2013   Lab Results  Component Value Date   CHOL 181 05/06/2013   Lab Results  Component Value Date   HDL 37* 05/06/2013   Lab Results  Component Value Date   LDLCALC 107* 05/06/2013   Lab Results  Component Value Date   TRIG 186* 05/06/2013   Lab Results  Component Value Date   CHOLHDL 4.9 05/06/2013     A/P  Bipolar depression Slightly worse, will try combo of Seroquel at  400 mg daily and Wellbutrin 150 mg daily. Encouraged appt with psychiatry  Unspecified sinusitis (chronic) Recurrent with nasal septal hypertrophy. Is seeing EMT Dr Jearld Fenton soon. encouraged nasal saline and probiotic

## 2013-09-08 NOTE — Assessment & Plan Note (Signed)
Recurrent with nasal septal hypertrophy. Is seeing EMT Dr Jearld Fenton soon. encouraged nasal saline and probiotic

## 2013-09-08 NOTE — Assessment & Plan Note (Signed)
Slightly worse, will try combo of Seroquel at  400 mg daily and Wellbutrin 150 mg daily. Encouraged appt with psychiatry

## 2013-09-10 ENCOUNTER — Other Ambulatory Visit (HOSPITAL_COMMUNITY): Payer: Self-pay | Admitting: Psychiatry

## 2013-09-10 DIAGNOSIS — F3130 Bipolar disorder, current episode depressed, mild or moderate severity, unspecified: Secondary | ICD-10-CM

## 2013-10-15 ENCOUNTER — Ambulatory Visit: Admitting: Family Medicine

## 2013-10-19 ENCOUNTER — Other Ambulatory Visit (HOSPITAL_COMMUNITY): Payer: Self-pay | Admitting: Psychiatry

## 2013-10-21 ENCOUNTER — Other Ambulatory Visit (HOSPITAL_COMMUNITY): Payer: Self-pay | Admitting: Psychiatry

## 2013-11-17 ENCOUNTER — Other Ambulatory Visit (HOSPITAL_COMMUNITY): Payer: Self-pay | Admitting: Psychiatry

## 2013-11-19 ENCOUNTER — Ambulatory Visit: Admitting: Family Medicine

## 2013-11-21 ENCOUNTER — Other Ambulatory Visit (HOSPITAL_COMMUNITY): Payer: Self-pay | Admitting: Family Medicine

## 2014-04-22 ENCOUNTER — Ambulatory Visit (INDEPENDENT_AMBULATORY_CARE_PROVIDER_SITE_OTHER): Admitting: Family Medicine

## 2014-04-22 ENCOUNTER — Encounter: Payer: Self-pay | Admitting: Family Medicine

## 2014-04-22 VITALS — BP 130/64 | HR 59 | Temp 98.0°F | Ht 79.0 in | Wt 272.0 lb

## 2014-04-22 DIAGNOSIS — K219 Gastro-esophageal reflux disease without esophagitis: Secondary | ICD-10-CM

## 2014-04-22 DIAGNOSIS — E663 Overweight: Secondary | ICD-10-CM

## 2014-04-22 DIAGNOSIS — F458 Other somatoform disorders: Secondary | ICD-10-CM

## 2014-04-22 DIAGNOSIS — R0989 Other specified symptoms and signs involving the circulatory and respiratory systems: Secondary | ICD-10-CM

## 2014-04-22 DIAGNOSIS — F3130 Bipolar disorder, current episode depressed, mild or moderate severity, unspecified: Secondary | ICD-10-CM

## 2014-04-22 DIAGNOSIS — F319 Bipolar disorder, unspecified: Secondary | ICD-10-CM

## 2014-04-22 DIAGNOSIS — R1310 Dysphagia, unspecified: Secondary | ICD-10-CM

## 2014-04-22 NOTE — Patient Instructions (Signed)
Globus Syndrome  Globus Syndrome is a feeling of a lump or a sensation of something caught in your throat. Eating food or drinking fluids does not seem to get rid of it. Yet it is not noticeable during the actual act of swallowing food or liquids. Usually there is nothing physically wrong. It is troublesome because it is an unpleasant sensation which is sometimes difficult to ignore and at times may seem to worsen. The syndrome is quite common. It is estimated 45% of the population experiences features of the condition at some stage during their lives. The symptoms are usually temporary. The largest group of people who feel the need to seek medical treatment is females between the ages of 30 to 60.   CAUSES   Globus Syndrome appears to be triggered by or aggravated by stress, anxiety and depression.  · Tension related to stress could product abnormal muscle spasms in the esophagus which would account for the sensation of a lump or ball in your throat.  · Frequent swallowing or drying of the throat caused by anxiety or other strong emotions can also produce this uncomfortable sensation in your throat.  · Fear and sadness can be expressed by the body in many ways. For instance, if you had a relative with throat cancer you might become overly concerned about your own health and develop uncomfortable sensations in your throat.  · The reaction to a crisis or a trauma event in your life can take the form of a lump in your throat. It is as if you are indirectly saying you can not handle or "swallow" one more thing.  DIAGNOSIS   Usually your caregiver will know what is wrong by talking to you and examining you.  If the condition persists for several days, more testing may be done to make sure there is not another problem present. This is usually not the case.  TREATMENT   · Reassurance is often the best treatment available. Usually the problem leaves without treatment over several days.  · Sometimes anti-anxiety medications  may be prescribed.  · Counseling or talk therapy can also help with strong underlying emotions.  · Note that in most cases this is not something that keeps coming back and you should not be concerned or worried.  Document Released: 11/19/2003 Document Revised: 11/21/2011 Document Reviewed: 04/17/2008  ExitCare® Patient Information ©2015 [employer]. This information is not intended to replace advice given to you by your health care provider. Make sure you discuss any questions you have with your health care provider.

## 2014-04-27 ENCOUNTER — Encounter: Payer: Self-pay | Admitting: Family Medicine

## 2014-04-27 DIAGNOSIS — R0989 Other specified symptoms and signs involving the circulatory and respiratory systems: Secondary | ICD-10-CM | POA: Insufficient documentation

## 2014-04-27 NOTE — Assessment & Plan Note (Signed)
Doing well without meds. No changes at this time

## 2014-04-27 NOTE — Progress Notes (Signed)
Patient ID: Jacalyn LefevreJoshua R Davidson, male   DOB: 01/23/1996, 18 y.o.   MRN: [id number] Jacalyn LefevreJoshua R Rivkin [id number] 03/24/1996 04/27/2014      Progress Note-Follow Up  Subjective  Chief Complaint  Chief Complaint  Patient presents with  . Follow-up    HPI  Patient is a 18 year old male in today for routine medical care. In today for followup. Has not been in a while but is doing very well. Continues in college and is hoping to go to medical school. Stopped her a several months ago and has done well without any concerning episodes of depression or mania and he has changed his diet and is eating much better. Is exercising and has had a result he stopped his Zyrtec and Zantac. He does continue to complain of globus in his throat but no dysphagia. No sputum production or obvious reflux. Denies CP/palp/SOB/HA/congestion/fevers/GI or GU c/o. Taking meds as prescribed  Past Medical History  Diagnosis Date  . Depression     bi-polar- type 2  . Chicken pox 18 yrs old  . Overweight   . Esophageal reflux 05/06/2013  . Headache(784.0) 05/06/2013  . Allergic state 05/12/2013  . Unspecified sinusitis (chronic) 06/02/2013  . Bipolar depression     bi-polar- type 2 Lamictal made mania worse     Past Surgical History  Procedure Laterality Date  . Tympanostomy tube placement  as a child    Family History  Problem Relation Age of Onset  . Graves' disease Mother   . Alcohol abuse Father   . Depression Father   . Bipolar disorder Father   . Drug abuse Father   . Hypertension Father   . Hyperlipidemia Father   . Asthma Brother   . Depression Brother   . Cancer Maternal Grandmother [phone]    colon  . Emphysema Maternal Grandfather     smoker  . Hypertension Maternal Grandfather   . Hyperlipidemia Maternal Grandfather   . Alcohol abuse Paternal Grandmother   . Alcohol abuse Paternal Grandfather   . Mental illness Maternal Aunt     schizophrenia, depression  . Depression Maternal Aunt     History    Social History  . Marital Status: Single    Spouse Name: N/A    Number of Children: N/A  . Years of Education: N/A   Occupational History  . Not on file.   Social History Main Topics  . Smoking status: Never Smoker   . Smokeless tobacco: Never Used  . Alcohol Use: No  . Drug Use: No  . Sexual Activity: No   Other Topics Concern  . Not on file   Social History Narrative  . No narrative on file    No current outpatient prescriptions on file prior to visit.   No current facility-administered medications on file prior to visit.    No Known Allergies  Review of Systems  Review of Systems  Constitutional: Negative for fever and malaise/fatigue.  HENT: Negative for congestion.   Eyes: Negative for discharge.  Respiratory: Negative for shortness of breath.   Cardiovascular: Negative for chest pain, palpitations and leg swelling.  Gastrointestinal: Negative for nausea, abdominal pain and diarrhea.  Genitourinary: Negative for dysuria.  Musculoskeletal: Negative for falls.  Skin: Negative for rash.  Neurological: Negative for loss of consciousness and headaches.  Endo/Heme/Allergies: Negative for polydipsia.  Psychiatric/Behavioral: Negative for depression and suicidal ideas. The patient is not nervous/anxious and does not have insomnia.     Objective  BP 130/64  Pulse 59  Temp(Src) 98 F (36.7 C) (Oral)  Ht 6\' 7"  (2.007 m)  Wt 272 lb (123.378 kg)  BMI 30.63 kg/m2  SpO2 97%  Physical Exam  Physical Exam  Constitutional: He is oriented to person, place, and time and well-developed, well-nourished, and in no distress. No distress.  HENT:  Head: Normocephalic and atraumatic.  Eyes: Conjunctivae are normal.  Neck: Neck supple. No thyromegaly present.  Cardiovascular: Normal rate, regular rhythm and normal heart sounds.   No murmur heard. Pulmonary/Chest: Effort normal and breath sounds normal. No respiratory distress.  Abdominal: He exhibits no distension  and no mass. There is no tenderness.  Musculoskeletal: He exhibits no edema.  Neurological: He is alert and oriented to person, place, and time.  Skin: Skin is warm.  Psychiatric: Memory, affect and judgment normal.    Lab Results  Component Value Date   TSH 2.844 05/06/2013   Lab Results  Component Value Date   WBC 8.2 05/06/2013   HGB 16.0 05/06/2013   HCT 46.0 05/06/2013   MCV 82.0 05/06/2013   PLT 273 05/06/2013   Lab Results  Component Value Date   CREATININE 0.95 05/06/2013   BUN 11 05/06/2013   NA 141 05/06/2013   K 4.9 05/06/2013   CL 106 05/06/2013   CO2 26 05/06/2013   Lab Results  Component Value Date   ALT 13 05/06/2013   AST 15 05/06/2013   ALKPHOS 75 05/06/2013   BILITOT 0.6 05/06/2013   Lab Results  Component Value Date   CHOL 181 05/06/2013   Lab Results  Component Value Date   HDL 37* 05/06/2013   Lab Results  Component Value Date   LDLCALC 107* 05/06/2013   Lab Results  Component Value Date   TRIG 186* 05/06/2013   Lab Results  Component Value Date   CHOLHDL 4.9 05/06/2013     Assessment & Plan  Esophageal reflux Improved with weight loss infrequent symptoms. Avoid offending foods, start probiotics. Do not eat large meals in late evening and consider raising head of bed.   Globus sensation Persistent despite improvement with weight loss and dietary changes will refer to ENT for visualization, should consider daily Zantac and Zyrtec again as needed  Overweight Great weight loss since last visit. Continue diet and exercise changes  Bipolar depression Doing well without meds. No changes at this time

## 2014-04-27 NOTE — Assessment & Plan Note (Signed)
Great weight loss since last visit. Continue diet and exercise changes

## 2014-04-27 NOTE — Assessment & Plan Note (Signed)
Improved with weight loss infrequent symptoms. Avoid offending foods, start probiotics. Do not eat large meals in late evening and consider raising head of bed.

## 2014-04-27 NOTE — Assessment & Plan Note (Signed)
Persistent despite improvement with weight loss and dietary changes will refer to ENT for visualization, should consider daily Zantac and Zyrtec again as needed

## 2014-08-19 ENCOUNTER — Ambulatory Visit (INDEPENDENT_AMBULATORY_CARE_PROVIDER_SITE_OTHER): Admitting: Family Medicine

## 2014-08-19 ENCOUNTER — Encounter: Payer: Self-pay | Admitting: Family Medicine

## 2014-08-19 VITALS — BP 131/75 | HR 65 | Temp 98.5°F | Ht 79.0 in | Wt 242.0 lb

## 2014-08-19 DIAGNOSIS — E663 Overweight: Secondary | ICD-10-CM

## 2014-08-19 DIAGNOSIS — K219 Gastro-esophageal reflux disease without esophagitis: Secondary | ICD-10-CM

## 2014-08-19 DIAGNOSIS — L709 Acne, unspecified: Secondary | ICD-10-CM

## 2014-08-19 DIAGNOSIS — T7840XD Allergy, unspecified, subsequent encounter: Secondary | ICD-10-CM

## 2014-08-19 DIAGNOSIS — F319 Bipolar disorder, unspecified: Secondary | ICD-10-CM

## 2014-08-19 DIAGNOSIS — F3130 Bipolar disorder, current episode depressed, mild or moderate severity, unspecified: Secondary | ICD-10-CM

## 2014-08-19 MED ORDER — MINOCYCLINE HCL 100 MG PO CAPS: 100.0000 mg | ORAL_CAPSULE | Freq: Two times a day (BID) | ORAL | Status: DC

## 2014-08-19 MED ORDER — CETIRIZINE HCL 10 MG PO TABS: 10.0000 mg | ORAL_TABLET | Freq: Every day | ORAL | Status: DC | PRN

## 2014-08-19 NOTE — Patient Instructions (Signed)
Probiotic daily such as Digestive Advantage or [employer]    Acne Acne is a skin problem that causes pimples. Acne occurs when the pores in your skin get blocked. Your pores may become red, sore, and swollen (inflamed), or infected with a common skin bacterium (Propionibacterium acnes). Acne is a common skin problem. Up to 80% of people get acne at some time. Acne is especially common from the ages of 312 to 2324. Acne usually goes away over time with proper treatment. CAUSES  Your pores each contain an oil gland. The oil glands make an oily substance called sebum. Acne happens when these glands get plugged with sebum, dead skin cells, and dirt. The P. acnes bacteria that are normally found in the oil glands then multiply, causing inflammation. Acne is commonly triggered by changes in your hormones. These hormonal changes can cause the oil glands to get bigger and to make more sebum. Factors that can make acne worse include:  Hormone changes during adolescence.  Hormone changes during women's menstrual cycles.  Hormone changes during pregnancy.  Oil-based cosmetics and hair products.  Harshly scrubbing the skin.  Strong soaps.  Stress.  Hormone problems due to certain diseases.  Long or oily hair rubbing against the skin.  Certain medicines.  Pressure from headbands, backpacks, or shoulder pads.  Exposure to certain oils and chemicals. SYMPTOMS  Acne often occurs on the face, neck, chest, and upper back. Symptoms include:  Small, red bumps (pimples or papules).  Whiteheads (closed comedones).  Blackheads (open comedones).  Small, pus-filled pimples (pustules).  Big, red pimples or pustules that feel tender. More severe acne can cause:  An infected area that contains a collection of pus (abscess).  Hard, painful, fluid-filled sacs (cysts).  Scars. DIAGNOSIS  Your caregiver can usually tell what the problem is by doing a physical exam. TREATMENT  There are  many good treatments for acne. Some are available over the counter and some are available with a prescription. The treatment that is best for you depends on the type of acne you have and how severe it is. It may take 2 months of treatment before your acne gets better. Common treatments include:  Creams and lotions that prevent oil glands from clogging.  Creams and lotions that treat or prevent infections and inflammation.  Antibiotics applied to the skin or taken as a pill.  Pills that decrease sebum production.  Birth control pills.  Light or laser treatments.  Minor surgery.  Injections of medicine into the affected areas.  Chemicals that cause peeling of the skin. HOME CARE INSTRUCTIONS  Good skin care is the most important part of treatment.  Wash your skin gently at least twice a day and after exercise. Always wash your skin before bed.  Use mild soap.  After each wash, apply a water-based skin moisturizer.  Keep your hair clean and off of your face. Shampoo your hair daily.  Only take medicines as directed by your caregiver.  Use a sunscreen or sunblock with SPF 30 or greater. This is especially important when you are using acne medicines.  Choose cosmetics that are noncomedogenic. This means they do not plug the oil glands.  Avoid leaning your chin or forehead on your hands.  Avoid wearing tight headbands or hats.  Avoid picking or squeezing your pimples. This can make your acne worse and cause scarring. SEEK MEDICAL CARE IF:   Your acne is not better after 8 weeks.  Your acne gets worse.  You  have a large area of skin that is red or tender. Document Released: 08/26/2000 Document Revised: 01/13/2014 Document Reviewed: 06/17/2011 [facility] Patient Information 2015 [place], [place]. This information is not intended to replace advice given to you by your health care provider. Make sure you discuss any questions you have with your health care provider.

## 2014-08-30 ENCOUNTER — Encounter: Payer: Self-pay | Admitting: Family Medicine

## 2014-08-30 DIAGNOSIS — L709 Acne, unspecified: Secondary | ICD-10-CM

## 2014-08-30 HISTORY — DX: Acne, unspecified: L70.9

## 2014-08-30 NOTE — Assessment & Plan Note (Signed)
Avoid offending foods, start probiotics. Do not eat large meals in late evening and consider raising head of bed.  

## 2014-08-30 NOTE — Assessment & Plan Note (Signed)
Doing well on current meds. Tolerating Abilify and following with psychiatry

## 2014-08-30 NOTE — Assessment & Plan Note (Signed)
Encouraged DASH diet, decrease po intake and increase exercise as tolerated. Needs 7-8 hours of sleep nightly. Avoid trans fats, eat small, frequent meals every 4-5 hours with lean proteins, complex carbs and healthy fats. Minimize simple carbs, GMO foods. 

## 2014-08-30 NOTE — Assessment & Plan Note (Signed)
Try Cetaphil soap, witch hazel astringent, Minocycline and probiotics

## 2014-08-30 NOTE — Assessment & Plan Note (Signed)
Mild recent flare, encouraged to use Flonase and zyrtec daily and hydrate well

## 2014-08-30 NOTE — Progress Notes (Signed)
Jacalyn LefevreJoshua R Sivertsen  [id number] 09/21/1995 08/30/2014      Progress Note-Follow Up  Subjective  Chief Complaint  Chief Complaint  Patient presents with  . Follow-up    4 month    HPI  Patient is a 18 y.o. male in today for routine medical care. Doing well. Is frustrated with increased facial acne lately. No recent illness, fevers, chills but is noting increased congestion with slight non productive cough this week. Denies CP/palp/SOB/HA/fevers/GI or GU c/o. Taking meds as prescribed  Past Medical History  Diagnosis Date  . Depression     bi-polar- type 2  . Chicken pox 18 yrs old  . Overweight   . Esophageal reflux 05/06/2013  . Headache(784.0) 05/06/2013  . Allergic state 05/12/2013  . Unspecified sinusitis (chronic) 06/02/2013  . Bipolar depression     bi-polar- type 2 Lamictal made mania worse   . Acne 08/30/2014    Past Surgical History  Procedure Laterality Date  . Tympanostomy tube placement  as a child    Family History  Problem Relation Age of Onset  . Graves' disease Mother   . Alcohol abuse Father   . Depression Father   . Bipolar disorder Father   . Drug abuse Father   . Hypertension Father   . Hyperlipidemia Father   . Asthma Brother   . Depression Brother   . Cancer Maternal Grandmother [phone]    colon  . Emphysema Maternal Grandfather     smoker  . Hypertension Maternal Grandfather   . Hyperlipidemia Maternal Grandfather   . Alcohol abuse Paternal Grandmother   . Alcohol abuse Paternal Grandfather   . Mental illness Maternal Aunt     schizophrenia, depression  . Depression Maternal Aunt     History   Social History  . Marital Status: Single    Spouse Name: N/A    Number of Children: N/A  . Years of Education: N/A   Occupational History  . Not on file.   Social History Main Topics  . Smoking status: Never Smoker   . Smokeless tobacco: Never Used  . Alcohol Use: No  . Drug Use: No  . Sexual Activity: No   Other Topics Concern  . Not on  file   Social History Narrative    No current outpatient prescriptions on file prior to visit.   No current facility-administered medications on file prior to visit.    No Known Allergies  Review of Systems  Review of Systems  Constitutional: Negative for fever and malaise/fatigue.  HENT: Positive for congestion.   Eyes: Negative for discharge.  Respiratory: Negative for shortness of breath.   Cardiovascular: Negative for chest pain, palpitations and leg swelling.  Gastrointestinal: Positive for heartburn. Negative for nausea, abdominal pain and diarrhea.  Genitourinary: Negative for dysuria.  Musculoskeletal: Negative for falls.  Skin: Positive for rash.  Neurological: Negative for loss of consciousness and headaches.  Endo/Heme/Allergies: Negative for polydipsia.  Psychiatric/Behavioral: Negative for depression and suicidal ideas. The patient is not nervous/anxious and does not have insomnia.     Objective  BP 131/75 mmHg  Pulse 65  Temp(Src) 98.5 F (36.9 C) (Oral)  Ht 6\' 7"  (2.007 m)  Wt 242 lb (109.77 kg)  BMI 27.25 kg/m2  SpO2 99%  Physical Exam  Physical Exam  Constitutional: He is oriented to person, place, and time and well-developed, well-nourished, and in no distress. No distress.  HENT:  Head: Normocephalic and atraumatic.  Eyes: Conjunctivae are normal.  Neck: Neck  supple. No thyromegaly present.  Cardiovascular: Normal rate, regular rhythm and normal heart sounds.   No murmur heard. Pulmonary/Chest: Effort normal and breath sounds normal. No respiratory distress.  Abdominal: He exhibits no distension and no mass. There is no tenderness.  Musculoskeletal: He exhibits no edema.  Neurological: He is alert and oriented to person, place, and time.  Skin: Skin is warm.  Cystic acne on face  Psychiatric: Memory, affect and judgment normal.    Lab Results  Component Value Date   TSH 2.844 05/06/2013   Lab Results  Component Value Date   WBC 8.2  05/06/2013   HGB 16.0 05/06/2013   HCT 46.0 05/06/2013   MCV 82.0 05/06/2013   PLT 273 05/06/2013   Lab Results  Component Value Date   CREATININE 0.95 05/06/2013   BUN 11 05/06/2013   NA 141 05/06/2013   K 4.9 05/06/2013   CL 106 05/06/2013   CO2 26 05/06/2013   Lab Results  Component Value Date   ALT 13 05/06/2013   AST 15 05/06/2013   ALKPHOS 75 05/06/2013   BILITOT 0.6 05/06/2013   Lab Results  Component Value Date   CHOL 181 05/06/2013   Lab Results  Component Value Date   HDL 37* 05/06/2013   Lab Results  Component Value Date   LDLCALC 107* 05/06/2013   Lab Results  Component Value Date   TRIG 186* 05/06/2013   Lab Results  Component Value Date   CHOLHDL 4.9 05/06/2013     Assessment & Plan  Esophageal reflux Avoid offending foods, start probiotics. Do not eat large meals in late evening and consider raising head of bed.   Allergic state Mild recent flare, encouraged to use Flonase and zyrtec daily and hydrate well  Overweight Encouraged DASH diet, decrease po intake and increase exercise as tolerated. Needs 7-8 hours of sleep nightly. Avoid trans fats, eat small, frequent meals every 4-5 hours with lean proteins, complex carbs and healthy fats. Minimize simple carbs, GMO foods.  Bipolar depression Doing well on current meds. Tolerating Abilify and following with psychiatry  Acne Try Cetaphil soap, witch hazel astringent, Minocycline and probiotics

## 2014-12-23 ENCOUNTER — Encounter: Payer: Self-pay | Admitting: Family Medicine

## 2014-12-23 ENCOUNTER — Ambulatory Visit (INDEPENDENT_AMBULATORY_CARE_PROVIDER_SITE_OTHER): Admitting: Family Medicine

## 2014-12-23 VITALS — BP 135/65 | HR 61 | Temp 98.6°F | Ht 79.0 in | Wt 288.4 lb

## 2014-12-23 DIAGNOSIS — F319 Bipolar disorder, unspecified: Secondary | ICD-10-CM | POA: Diagnosis not present

## 2014-12-23 DIAGNOSIS — F3130 Bipolar disorder, current episode depressed, mild or moderate severity, unspecified: Secondary | ICD-10-CM

## 2014-12-23 DIAGNOSIS — K219 Gastro-esophageal reflux disease without esophagitis: Secondary | ICD-10-CM

## 2014-12-23 MED ORDER — MINOCYCLINE HCL 100 MG PO CAPS: 100.0000 mg | ORAL_CAPSULE | Freq: Two times a day (BID) | ORAL | Status: DC

## 2014-12-23 MED ORDER — FLUOXETINE HCL 40 MG PO CAPS: 40.0000 mg | ORAL_CAPSULE | Freq: Every day | ORAL | Status: DC

## 2014-12-23 MED ORDER — CETIRIZINE HCL 10 MG PO TABS: 10.0000 mg | ORAL_TABLET | Freq: Every day | ORAL | Status: DC | PRN

## 2014-12-23 NOTE — Progress Notes (Signed)
Pre visit review using our clinic review tool, if applicable. No additional management support is needed unless otherwise documented below in the visit note. 

## 2014-12-29 ENCOUNTER — Encounter: Payer: Self-pay | Admitting: Family Medicine

## 2014-12-29 NOTE — Assessment & Plan Note (Signed)
Avoid offending foods, start probiotics. Do not eat large meals in late evening and consider raising head of bed.  

## 2014-12-29 NOTE — Assessment & Plan Note (Signed)
Has been struggling some recently, is not doing well at school at present. Is encouraged to go talk to a counselor at school to see what his options are for quitting classes without penalty. He has an appt to see the psychiatric nurse at school. He continues his Lamictal and Prozac but they are not working as well as before. Will increase his prozac and he has agreed that if his depression worsens or he becomes concerned about his safety or anyone elses he will [facility].

## 2014-12-29 NOTE — Progress Notes (Signed)
Lawrence Collins  [id number] 1996-07-01 12/29/2014      Progress Note-Follow Up  Subjective  Chief Complaint  Chief Complaint  Patient presents with  . Follow-up    HPI  Patient is a 19 y.o. male in today for routine medical care. Patient is in today for follow-up and has been struggling lately. He is having trouble in school due to lack of motivation and difficulty concentrating. He has had some very severe depression and he even fleeting moments of suicidal thoughts but he has not developed any plan and does not feel as if he has any risk of hurting himself at the present time denies any risk to anyone else. Is frustrated that he has been unable to control his disease state but he offers no complaints of acute illness. Denies CP/palp/SOB/HA/congestion/fevers/GI or GU c/o. Taking meds as prescribed  Past Medical History  Diagnosis Date  . Depression     bi-polar- type 2  . Chicken pox 19 yrs old  . Overweight   . Esophageal reflux 05/06/2013  . Headache(784.0) 05/06/2013  . Allergic state 05/12/2013  . Unspecified sinusitis (chronic) 06/02/2013  . Bipolar depression     bi-polar- type 2 Lamictal made mania worse   . Acne 08/30/2014    Past Surgical History  Procedure Laterality Date  . Tympanostomy tube placement  as a child    Family History  Problem Relation Age of Onset  . Graves' disease Mother   . Alcohol abuse Father   . Depression Father   . Bipolar disorder Father   . Drug abuse Father   . Hypertension Father   . Hyperlipidemia Father   . Asthma Brother   . Depression Brother   . Cancer Maternal Grandmother 68    colon  . Emphysema Maternal Grandfather     smoker  . Hypertension Maternal Grandfather   . Hyperlipidemia Maternal Grandfather   . Alcohol abuse Paternal Grandmother   . Alcohol abuse Paternal Grandfather   . Mental illness Maternal Aunt     schizophrenia, depression  . Depression Maternal Aunt     History   Social History  . Marital  Status: Single    Spouse Name: N/A  . Number of Children: N/A  . Years of Education: N/A   Occupational History  . Not on file.   Social History Main Topics  . Smoking status: Never Smoker   . Smokeless tobacco: Never Used  . Alcohol Use: No  . Drug Use: No  . Sexual Activity: No   Other Topics Concern  . Not on file   Social History Narrative    Current Outpatient Prescriptions on File Prior to Visit  Medication Sig Dispense Refill  . Multiple Vitamin (MULTIVITAMIN) tablet Take 1 tablet by mouth daily.    . fluticasone (FLONASE) 50 MCG/ACT nasal spray Place 2 sprays into both nostrils daily.     No current facility-administered medications on file prior to visit.    No Known Allergies  Review of Systems  Review of Systems  Constitutional: Negative for fever and malaise/fatigue.  HENT: Negative for congestion.   Eyes: Negative for discharge.  Respiratory: Negative for shortness of breath.   Cardiovascular: Negative for chest pain, palpitations and leg swelling.  Gastrointestinal: Negative for nausea, abdominal pain and diarrhea.  Genitourinary: Negative for dysuria.  Musculoskeletal: Negative for falls.  Skin: Negative for rash.  Neurological: Negative for loss of consciousness and headaches.  Endo/Heme/Allergies: Negative for polydipsia.  Psychiatric/Behavioral: Positive for depression. Negative  for suicidal ideas. The patient is nervous/anxious. The patient does not have insomnia.     Objective  BP 135/65 mmHg  Pulse 61  Temp(Src) 98.6 F (37 C) (Oral)  Ht 6\' 7"  (2.007 m)  Wt 288 lb 6 oz (130.806 kg)  BMI 32.47 kg/m2  SpO2 99%  Physical Exam  Physical Exam  Constitutional: He is oriented to person, place, and time and well-developed, well-nourished, and in no distress. No distress.  HENT:  Head: Normocephalic and atraumatic.  Eyes: Conjunctivae are normal.  Neck: Neck supple. No thyromegaly present.  Cardiovascular: Normal rate, regular rhythm and  normal heart sounds.   No murmur heard. Pulmonary/Chest: Effort normal and breath sounds normal. No respiratory distress.  Abdominal: He exhibits no distension and no mass. There is no tenderness.  Musculoskeletal: He exhibits no edema.  Neurological: He is alert and oriented to person, place, and time.  Skin: Skin is warm.  Psychiatric: Memory, affect and judgment normal.    Lab Results  Component Value Date   TSH 2.844 05/06/2013   Lab Results  Component Value Date   WBC 8.2 05/06/2013   HGB 16.0 05/06/2013   HCT 46.0 05/06/2013   MCV 82.0 05/06/2013   PLT 273 05/06/2013   Lab Results  Component Value Date   CREATININE 0.95 05/06/2013   BUN 11 05/06/2013   NA 141 05/06/2013   K 4.9 05/06/2013   CL 106 05/06/2013   CO2 26 05/06/2013   Lab Results  Component Value Date   ALT 13 05/06/2013   AST 15 05/06/2013   ALKPHOS 75 05/06/2013   BILITOT 0.6 05/06/2013   Lab Results  Component Value Date   CHOL 181 05/06/2013   Lab Results  Component Value Date   HDL 37* 05/06/2013   Lab Results  Component Value Date   LDLCALC 107* 05/06/2013   Lab Results  Component Value Date   TRIG 186* 05/06/2013   Lab Results  Component Value Date   CHOLHDL 4.9 05/06/2013     Assessment & Plan  Esophageal reflux Avoid offending foods, start probiotics. Do not eat large meals in late evening and consider raising head of bed.    Bipolar depression Has been struggling some recently, is not doing well at school at present. Is encouraged to go talk to a counselor at school to see what his options are for quitting classes without penalty. He has an appt to see the psychiatric nurse at school. He continues his Lamictal and Prozac but they are not working as well as before. Will increase his prozac and he has agreed that if his depression worsens or he becomes concerned about his safety or anyone elses he will [facility]

## 2015-01-12 ENCOUNTER — Ambulatory Visit (INDEPENDENT_AMBULATORY_CARE_PROVIDER_SITE_OTHER): Admitting: Psychology

## 2015-01-12 DIAGNOSIS — F332 Major depressive disorder, recurrent severe without psychotic features: Secondary | ICD-10-CM | POA: Diagnosis not present

## 2015-01-23 ENCOUNTER — Ambulatory Visit (INDEPENDENT_AMBULATORY_CARE_PROVIDER_SITE_OTHER): Admitting: Psychology

## 2015-01-23 DIAGNOSIS — F332 Major depressive disorder, recurrent severe without psychotic features: Secondary | ICD-10-CM

## 2015-02-02 ENCOUNTER — Ambulatory Visit (INDEPENDENT_AMBULATORY_CARE_PROVIDER_SITE_OTHER): Admitting: Psychology

## 2015-02-02 DIAGNOSIS — F332 Major depressive disorder, recurrent severe without psychotic features: Secondary | ICD-10-CM

## 2015-02-03 ENCOUNTER — Encounter: Payer: Self-pay | Admitting: Family Medicine

## 2015-02-03 ENCOUNTER — Ambulatory Visit (INDEPENDENT_AMBULATORY_CARE_PROVIDER_SITE_OTHER): Admitting: Family Medicine

## 2015-02-03 VITALS — BP 122/80 | HR 78 | Temp 98.9°F | Ht 79.0 in | Wt 296.1 lb

## 2015-02-03 DIAGNOSIS — K219 Gastro-esophageal reflux disease without esophagitis: Secondary | ICD-10-CM

## 2015-02-03 DIAGNOSIS — F319 Bipolar disorder, unspecified: Secondary | ICD-10-CM

## 2015-02-03 DIAGNOSIS — F3130 Bipolar disorder, current episode depressed, mild or moderate severity, unspecified: Secondary | ICD-10-CM

## 2015-02-03 NOTE — Progress Notes (Signed)
Pre visit review using our clinic review tool, if applicable. No additional management support is needed unless otherwise documented below in the visit note. 

## 2015-02-03 NOTE — Patient Instructions (Signed)
Needs a lab visit for this Friday

## 2015-02-06 ENCOUNTER — Other Ambulatory Visit

## 2015-02-09 ENCOUNTER — Encounter: Payer: Self-pay | Admitting: Family Medicine

## 2015-02-09 NOTE — Assessment & Plan Note (Signed)
Much better today now that he is back on his meds. 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

## 2015-02-09 NOTE — Assessment & Plan Note (Signed)
Avoid offending foods, start probiotics. Do not eat large meals in late evening and consider raising head of bed. Doing better 

## 2015-02-09 NOTE — Progress Notes (Signed)
Lawrence Collins  [id number] July 25, 1996 02/09/2015      Progress Note-Follow Up  Subjective  Chief Complaint  Chief Complaint  Patient presents with  . Follow-up    HPI  Patient is a 19 y.o. male in today for routine medical care. Patient is in today for follow-up and is doing much better. His mood is greatly improved with the initiation of his medicines once again. He is also started counseling and has found that helpful. He was able to finish the semester at school but did not get fabulous grades. Has not taken a job with [employer] and is looking forward to that. No recent or acute illness. Denies CP/palp/SOB/HA/congestion/fevers/GI or GU c/o. Taking meds as prescribed  Past Medical History  Diagnosis Date  . Depression     bi-polar- type 2  . Chicken pox 19 yrs old  . Overweight   . Esophageal reflux 05/06/2013  . Headache(784.0) 05/06/2013  . Allergic state 05/12/2013  . Unspecified sinusitis (chronic) 06/02/2013  . Bipolar depression     bi-polar- type 2 Lamictal made mania worse   . Acne 08/30/2014    Past Surgical History  Procedure Laterality Date  . Tympanostomy tube placement  as a child    Family History  Problem Relation Age of Onset  . Graves' disease Mother   . Alcohol abuse Father   . Depression Father   . Bipolar disorder Father   . Drug abuse Father   . Hypertension Father   . Hyperlipidemia Father   . Asthma Brother   . Depression Brother   . Cancer Maternal Grandmother 69    colon  . Emphysema Maternal Grandfather     smoker  . Hypertension Maternal Grandfather   . Hyperlipidemia Maternal Grandfather   . Alcohol abuse Paternal Grandmother   . Alcohol abuse Paternal Grandfather   . Mental illness Maternal Aunt     schizophrenia, depression  . Depression Maternal Aunt     History   Social History  . Marital Status: Single    Spouse Name: N/A  . Number of Children: N/A  . Years of Education: N/A   Occupational History  . Not  on file.   Social History Main Topics  . Smoking status: Never Smoker   . Smokeless tobacco: Never Used  . Alcohol Use: No  . Drug Use: No  . Sexual Activity: No   Other Topics Concern  . Not on file   Social History Narrative    Current Outpatient Prescriptions on File Prior to Visit  Medication Sig Dispense Refill  . cetirizine (ZYRTEC) 10 MG tablet Take 1 tablet (10 mg total) by mouth daily as needed for allergies. 30 tablet 6  . FLUoxetine (PROZAC) 40 MG capsule Take 1 capsule (40 mg total) by mouth daily. 90 capsule 3  . fluticasone (FLONASE) 50 MCG/ACT nasal spray Place 2 sprays into both nostrils daily.    Marland Kitchen lamoTRIgine (LAMICTAL) 150 MG tablet Take 150 mg by mouth daily.    . minocycline (MINOCIN) 100 MG capsule Take 1 capsule (100 mg total) by mouth 2 (two) times daily. 60 capsule 4  . Multiple Vitamin (MULTIVITAMIN) tablet Take 1 tablet by mouth daily.     No current facility-administered medications on file prior to visit.    No Known Allergies  Review of Systems  Review of Systems  Constitutional: Negative for fever and malaise/fatigue.  HENT: Negative for congestion.   Eyes: Negative for discharge.  Respiratory: Negative for  shortness of breath.   Cardiovascular: Negative for chest pain, palpitations and leg swelling.  Gastrointestinal: Negative for nausea, abdominal pain and diarrhea.  Genitourinary: Negative for dysuria.  Musculoskeletal: Negative for falls.  Skin: Negative for rash.  Neurological: Negative for loss of consciousness and headaches.  Endo/Heme/Allergies: Negative for polydipsia.  Psychiatric/Behavioral: Negative for depression and suicidal ideas. The patient is not nervous/anxious and does not have insomnia.     Objective  BP 122/80 mmHg  Pulse 78  Temp(Src) 98.9 F (37.2 C) (Oral)  Ht 6\' 7"  (2.007 m)  Wt 296 lb 2 oz (134.321 kg)  BMI 33.35 kg/m2  SpO2 96%  Physical Exam  Physical Exam  Constitutional: He is oriented to person,  place, and time and well-developed, well-nourished, and in no distress. No distress.  HENT:  Head: Normocephalic and atraumatic.  Eyes: Conjunctivae are normal.  Neck: Neck supple. No thyromegaly present.  Cardiovascular: Normal rate, regular rhythm and normal heart sounds.   No murmur heard. Pulmonary/Chest: Effort normal and breath sounds normal. No respiratory distress.  Abdominal: He exhibits no distension and no mass. There is no tenderness.  Musculoskeletal: He exhibits no edema.  Neurological: He is alert and oriented to person, place, and time.  Skin: Skin is warm.  Psychiatric: Memory, affect and judgment normal.    Lab Results  Component Value Date   TSH 2.844 05/06/2013   Lab Results  Component Value Date   WBC 8.2 05/06/2013   HGB 16.0 05/06/2013   HCT 46.0 05/06/2013   MCV 82.0 05/06/2013   PLT 273 05/06/2013   Lab Results  Component Value Date   CREATININE 0.95 05/06/2013   BUN 11 05/06/2013   NA 141 05/06/2013   K 4.9 05/06/2013   CL 106 05/06/2013   CO2 26 05/06/2013   Lab Results  Component Value Date   ALT 13 05/06/2013   AST 15 05/06/2013   ALKPHOS 75 05/06/2013   BILITOT 0.6 05/06/2013   Lab Results  Component Value Date   CHOL 181 05/06/2013   Lab Results  Component Value Date   HDL 37* 05/06/2013   Lab Results  Component Value Date   LDLCALC 107* 05/06/2013   Lab Results  Component Value Date   TRIG 186* 05/06/2013   Lab Results  Component Value Date   CHOLHDL 4.9 05/06/2013     Assessment & Plan  Esophageal reflux Avoid offending foods, start probiotics. Do not eat large meals in late evening and consider raising head of bed. Doing better   Bipolar depression Much better today now that he is back on his meds. 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

## 2015-02-10 ENCOUNTER — Other Ambulatory Visit: Payer: Self-pay | Admitting: Family Medicine

## 2015-02-10 DIAGNOSIS — Z23 Encounter for immunization: Secondary | ICD-10-CM

## 2015-02-12 ENCOUNTER — Encounter: Payer: Self-pay | Admitting: Family Medicine

## 2015-02-12 ENCOUNTER — Other Ambulatory Visit: Payer: Self-pay

## 2015-02-16 ENCOUNTER — Ambulatory Visit: Admitting: Psychology

## 2015-03-09 ENCOUNTER — Other Ambulatory Visit

## 2015-04-02 ENCOUNTER — Ambulatory Visit: Admitting: Family Medicine

## 2015-06-28 ENCOUNTER — Other Ambulatory Visit: Payer: Self-pay | Admitting: Family Medicine

## 2015-09-13 ENCOUNTER — Emergency Department (INDEPENDENT_AMBULATORY_CARE_PROVIDER_SITE_OTHER)
Admission: EM | Admit: 2015-09-13 | Discharge: 2015-09-13 | Disposition: A | Discharge status: Home / Self Care | Source: Home / Self Care | Attending: Family Medicine | Admitting: Family Medicine

## 2015-09-13 ENCOUNTER — Encounter: Payer: Self-pay | Admitting: Emergency Medicine

## 2015-09-13 DIAGNOSIS — J029 Acute pharyngitis, unspecified: Secondary | ICD-10-CM | POA: Diagnosis not present

## 2015-09-13 LAB — POCT RAPID STREP A (OFFICE): Rapid Strep A Screen: NEGATIVE

## 2015-09-13 MED ORDER — IBUPROFEN 600 MG PO TABS: 600.0000 mg | ORAL_TABLET | Freq: Four times a day (QID) | ORAL | Status: DC | PRN

## 2015-09-13 MED ORDER — AMOXICILLIN 500 MG PO CAPS: 500.0000 mg | ORAL_CAPSULE | Freq: Two times a day (BID) | ORAL | Status: DC

## 2015-09-13 NOTE — ED Notes (Signed)
Pt c/o possible strep throat, sore throat x 1 week.

## 2015-09-13 NOTE — Discharge Instructions (Signed)
You may take 400-600mg  Ibuprofen (Motrin) every 6-8 hours for fever and pain  Alternate with Tylenol  You may take 500mg  Tylenol every 4-6 hours as needed for fever and pain  Follow-up with your primary care provider next week for recheck of symptoms if not improving.  Be sure to drink plenty of fluids and rest, at least 8hrs of sleep a night, preferably more while you are sick. Return urgent care or go to closest ER if you cannot keep down fluids/signs of dehydration, fever not reducing with Tylenol, difficulty breathing/wheezing, stiff neck, worsening condition, or other concerns (see below)   Today, your rapid strep test was negative, however, your exam is still concerning for strep throat.  Additional tests have been sent out.  You may try alternating tylenol and motrin as well as using warm salt water gargles to help with pain while waiting on test results.  If symptoms continue to worsen including fever or severe throat pain, you may fill the antibiotic.  Please fill the antibiotic if the culture results come back positive.  You should be notified by our urgent care in 2-3 days of results. Pharyngitis Pharyngitis is redness, pain, and swelling (inflammation) of your pharynx.  CAUSES  Pharyngitis is usually caused by infection. Most of the time, these infections are from viruses (viral) and are part of a cold. However, sometimes pharyngitis is caused by bacteria (bacterial). Pharyngitis can also be caused by allergies. Viral pharyngitis may be spread from person to person by coughing, sneezing, and personal items or utensils (cups, forks, spoons, toothbrushes). Bacterial pharyngitis may be spread from person to person by more intimate contact, such as kissing.  SIGNS AND SYMPTOMS  Symptoms of pharyngitis include:   Sore throat.   Tiredness (fatigue).   Low-grade fever.   Headache.  Joint pain and muscle aches.  Skin rashes.  Swollen lymph nodes.  Plaque-like film on throat or  tonsils (often seen with bacterial pharyngitis). DIAGNOSIS  Your health care provider will ask you questions about your illness and your symptoms. Your medical history, along with a physical exam, is often all that is needed to diagnose pharyngitis. Sometimes, a rapid strep test is done. Other lab tests may also be done, depending on the suspected cause.  TREATMENT  Viral pharyngitis will usually get better in 3-4 days without the use of medicine. Bacterial pharyngitis is treated with medicines that kill germs (antibiotics).  HOME CARE INSTRUCTIONS   Drink enough water and fluids to keep your urine clear or pale yellow.   Only take over-the-counter or prescription medicines as directed by your health care provider:   If you are prescribed antibiotics, make sure you finish them even if you start to feel better.   Do not take aspirin.   Get lots of rest.   Gargle with 8 oz of salt water ( tsp of salt per 1 qt of water) as often as every 1-2 hours to soothe your throat.   Throat lozenges (if you are not at risk for choking) or sprays may be used to soothe your throat. SEEK MEDICAL CARE IF:   You have large, tender lumps in your neck.  You have a rash.  You cough up green, yellow-brown, or bloody spit. SEEK IMMEDIATE MEDICAL CARE IF:   Your neck becomes stiff.  You drool or are unable to swallow liquids.  You vomit or are unable to keep medicines or liquids down.  You have severe pain that does not go away with the use  of recommended medicines.  You have trouble breathing (not caused by a stuffy nose). MAKE SURE YOU:   Understand these instructions.  Will watch your condition.  Will get help right away if you are not doing well or get worse.   This information is not intended to replace advice given to you by your health care provider. Make sure you discuss any questions you have with your health care provider.   Document Released: 08/29/2005 Document Revised:  06/19/2013 Document Reviewed: 05/06/2013 Elsevier Interactive Patient Education [employer]   Please take antibiotics as prescribed and be sure to complete entire course even if you start to feel better to ensure infection does not come back.

## 2015-09-13 NOTE — ED Provider Notes (Signed)
CSN: [id number]     Arrival date & time 09/13/15  1542 History   First MD Initiated Contact with Patient 09/13/15 1603     Chief Complaint  Patient presents with  . Sore Throat   (Consider location/radiation/quality/duration/timing/severity/associated sxs/prior Treatment) HPI  Pt is a 20yo male presenting to [facility] with c/o gradually worsening sore throat with associated fever, chills, body aches, fatigue, and mild intermittent non-productive cough and hoarse voice.  He has been trying tylenol and motrin intermittently.  Denies difficulty breathing or swallowing but swallowing is more painful. Throat pain is mild to moderate in severity, aching and sore.  He reports mild nausea but denies vomiting or diarrhea.   Past Medical History  Diagnosis Date  . Depression     bi-polar- type 2  . Chicken pox 20 yrs old  . Overweight   . Esophageal reflux 05/06/2013  . Headache(784.0) 05/06/2013  . Allergic state 05/12/2013  . Unspecified sinusitis (chronic) 06/02/2013  . Bipolar depression (HCC)     bi-polar- type 2 Lamictal made mania worse   . Acne 08/30/2014   Past Surgical History  Procedure Laterality Date  . Tympanostomy tube placement  as a child   Family History  Problem Relation Age of Onset  . Graves' disease Mother   . Alcohol abuse Father   . Depression Father   . Bipolar disorder Father   . Drug abuse Father   . Hypertension Father   . Hyperlipidemia Father   . Asthma Brother   . Depression Brother   . Cancer Maternal Grandmother 37    colon  . Emphysema Maternal Grandfather     smoker  . Hypertension Maternal Grandfather   . Hyperlipidemia Maternal Grandfather   . Alcohol abuse Paternal Grandmother   . Alcohol abuse Paternal Grandfather   . Mental illness Maternal Aunt     schizophrenia, depression  . Depression Maternal Aunt    Social History  Substance Use Topics  . Smoking status: Never Smoker   . Smokeless tobacco: Never Used  . Alcohol Use: Yes     Comment: 1-2  drinks per week    Review of Systems  Constitutional: Positive for fever, chills and fatigue.  HENT: Positive for sore throat and voice change. Negative for congestion, ear pain and trouble swallowing.   Respiratory: Positive for cough. Negative for shortness of breath.   Cardiovascular: Negative for chest pain and palpitations.  Gastrointestinal: Positive for nausea. Negative for vomiting, abdominal pain and diarrhea.  Musculoskeletal: Positive for myalgias and arthralgias. Negative for back pain.  Skin: Negative for rash.  Neurological: Positive for dizziness and headaches. Negative for light-headedness.  All other systems reviewed and are negative.   Allergies  Review of patient's allergies indicates no known allergies.  Home Medications   Prior to Admission medications   Medication Sig Start Date End Date Taking? Authorizing Provider  amoxicillin (AMOXIL) 500 MG capsule Take 1 capsule (500 mg total) by mouth 2 (two) times daily. For 10 days 09/13/15   Junius Finner, PA-C  ibuprofen (ADVIL,MOTRIN) 600 MG tablet Take 1 tablet (600 mg total) by mouth every 6 (six) hours as needed. 09/13/15   Junius Finner, PA-C   Meds Ordered and Administered this Visit  Medications - No data to display  BP 120/73 mmHg  Pulse 88  Temp(Src) 98.5 F (36.9 C) (Oral)  Ht 6\' 6"  (1.981 m)  Wt 305 lb 12 oz (138.687 kg)  BMI 35.34 kg/m2  SpO2 98% No data found.  Physical Exam  Constitutional: He appears well-developed and well-nourished.  HENT:  Head: Normocephalic and atraumatic.  Right Ear: Hearing, tympanic membrane, external ear and ear canal normal.  Left Ear: Hearing, tympanic membrane, external ear and ear canal normal.  Nose: Nose normal.  Mouth/Throat: Uvula is midline and mucous membranes are normal. No trismus in the jaw. Oropharyngeal exudate (Right side), posterior oropharyngeal edema and posterior oropharyngeal erythema present. No tonsillar abscesses.  Eyes: Conjunctivae are  normal. No scleral icterus.  Neck: Normal range of motion. Neck supple.  Cardiovascular: Normal rate, regular rhythm and normal heart sounds.   Pulmonary/Chest: Effort normal and breath sounds normal. No stridor. No respiratory distress. He has no wheezes. He has no rales. He exhibits no tenderness.  Abdominal: Soft. He exhibits no distension and no mass. There is no tenderness. There is no rebound and no guarding.  Musculoskeletal: Normal range of motion.  Lymphadenopathy:    He has cervical adenopathy.  Neurological: He is alert.  Skin: Skin is warm and dry.  Nursing note and vitals reviewed.   ED Course  Procedures (including critical care time)  Labs Review Labs Reviewed  STREP A DNA PROBE  POCT RAPID STREP A (OFFICE)    Imaging Review No results found.    MDM   1. Acute pharyngitis, unspecified etiology    Pt c/o worsening sore throat with body aches and subjective fever. Tonsillar erythema, edema, and exudates, worse on Right side. Uvula is midline.  No tonsillar abscess on exam. Anterior cervical lymphadenopathy noted on exam  Rapid strep: negative, will send culture.   Encouraged fluids, rest, acetaminophen and ibuprofen for fever and pain.  Rx: amoxicillin as exam still concerning for possible strep throat. Encouraged symptomatic treatment as culture pending, however, may fill if symptoms worsening including persistent fever or worsening sore throat.  Home care instructions provided. F/u with PCP in 7-10 days if not improving, sooner if worsening. Patient verbalized understanding and agreement with treatment plan.     Junius Finnerrin O'Malley, PA-C 09/13/15 [phone]

## 2015-09-16 ENCOUNTER — Telehealth: Payer: Self-pay | Admitting: *Deleted

## 2015-09-16 LAB — STREP A DNA PROBE: GASP: NOT DETECTED

## 2016-05-02 ENCOUNTER — Ambulatory Visit (INDEPENDENT_AMBULATORY_CARE_PROVIDER_SITE_OTHER): Payer: BLUE CROSS/BLUE SHIELD

## 2016-05-02 ENCOUNTER — Ambulatory Visit (INDEPENDENT_AMBULATORY_CARE_PROVIDER_SITE_OTHER): Payer: BLUE CROSS/BLUE SHIELD | Admitting: Sports Medicine

## 2016-05-02 ENCOUNTER — Encounter: Payer: Self-pay | Admitting: Sports Medicine

## 2016-05-02 DIAGNOSIS — M25562 Pain in left knee: Principal | ICD-10-CM

## 2016-05-02 DIAGNOSIS — S8012XA Contusion of left lower leg, initial encounter: Secondary | ICD-10-CM

## 2016-05-02 DIAGNOSIS — M25462 Effusion, left knee: Secondary | ICD-10-CM | POA: Diagnosis not present

## 2016-05-02 DIAGNOSIS — M7122 Synovial cyst of popliteal space [Baker], left knee: Secondary | ICD-10-CM | POA: Diagnosis not present

## 2016-05-02 DIAGNOSIS — S838X2A Sprain of other specified parts of left knee, initial encounter: Secondary | ICD-10-CM | POA: Diagnosis not present

## 2016-05-02 MED ORDER — MELOXICAM 15 MG PO TABS: ORAL_TABLET | ORAL | 3 refills | Status: AC
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
## 2016-05-02 NOTE — Assessment & Plan Note (Signed)
Question PCL, anterior cruciate ligament, and MCL injuries. Lots of pain at the medial joint line as well. X-rays and MRI.

## 2016-05-17 ENCOUNTER — Ambulatory Visit: Payer: Self-pay | Admitting: Sports Medicine

## 2016-05-29 ENCOUNTER — Encounter: Payer: Self-pay | Admitting: Family Medicine
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
